# Patient Record
Sex: Female | Born: 1945 | Race: White | Hispanic: No | Marital: Married | Smoking: Never smoker
Health system: Southern US, Community
[De-identification: ages and names within clinical notes are randomized; demographics above are authoritative.]

## PROBLEM LIST (undated history)

## (undated) DIAGNOSIS — G4700 Insomnia, unspecified: Secondary | ICD-10-CM

## (undated) DIAGNOSIS — E785 Hyperlipidemia, unspecified: Secondary | ICD-10-CM

## (undated) DIAGNOSIS — F419 Anxiety disorder, unspecified: Secondary | ICD-10-CM

## (undated) DIAGNOSIS — Z9109 Other allergy status, other than to drugs and biological substances: Secondary | ICD-10-CM

## (undated) DIAGNOSIS — E538 Deficiency of other specified B group vitamins: Secondary | ICD-10-CM

## (undated) DIAGNOSIS — R600 Localized edema: Secondary | ICD-10-CM

## (undated) DIAGNOSIS — E039 Hypothyroidism, unspecified: Secondary | ICD-10-CM

## (undated) HISTORY — PX: INCONTINENCE SURGERY: SHX676

## (undated) HISTORY — DX: Hyperlipidemia, unspecified: E78.5

## (undated) HISTORY — DX: Localized edema: R60.0

## (undated) HISTORY — PX: PARTIAL HYSTERECTOMY: SHX80

## (undated) HISTORY — PX: ELBOW SURGERY: SHX618

## (undated) HISTORY — DX: Other allergy status, other than to drugs and biological substances: Z91.09

## (undated) HISTORY — DX: Insomnia, unspecified: G47.00

---

## 1964-09-11 HISTORY — PX: TONSILLECTOMY: SUR1361

## 1999-09-27 ENCOUNTER — Encounter (INDEPENDENT_AMBULATORY_CARE_PROVIDER_SITE_OTHER): Payer: Self-pay | Admitting: Specialist

## 1999-09-27 ENCOUNTER — Other Ambulatory Visit: Admission: RE | Admit: 1999-09-27 | Discharge: 1999-09-27 | Payer: Self-pay | Admitting: Gastroenterology

## 2009-03-18 ENCOUNTER — Encounter (INDEPENDENT_AMBULATORY_CARE_PROVIDER_SITE_OTHER): Payer: Self-pay | Admitting: *Deleted

## 2010-01-27 ENCOUNTER — Encounter (INDEPENDENT_AMBULATORY_CARE_PROVIDER_SITE_OTHER): Payer: Self-pay | Admitting: *Deleted

## 2010-01-31 ENCOUNTER — Encounter (INDEPENDENT_AMBULATORY_CARE_PROVIDER_SITE_OTHER): Payer: Self-pay | Admitting: *Deleted

## 2010-02-03 ENCOUNTER — Ambulatory Visit: Payer: Self-pay | Admitting: Internal Medicine

## 2010-02-14 ENCOUNTER — Ambulatory Visit: Payer: Self-pay | Admitting: Internal Medicine

## 2010-02-16 ENCOUNTER — Encounter: Payer: Self-pay | Admitting: Internal Medicine

## 2010-04-13 ENCOUNTER — Emergency Department: Payer: Self-pay | Admitting: Emergency Medicine

## 2010-10-11 NOTE — Miscellaneous (Signed)
Summary: Moviprep rx  Clinical Lists Changes  Medications: Added new medication of MOVIPREP 100 GM  SOLR (PEG-KCL-NACL-NASULF-NA ASC-C) As per prep instructions. - Signed Rx of MOVIPREP 100 GM  SOLR (PEG-KCL-NACL-NASULF-NA ASC-C) As per prep instructions.;  #1 x 0;  Signed;  Entered by: Karl Bales RN;  Authorized by: Hilarie Fredrickson MD;  Method used: [id number] [address], [place], [place], [place]  [address], Ph: [phone], Fax: [phone] Observations: Added new observation of NKA: T (02/03/2010 11:20)    Prescriptions: MOVIPREP 100 GM  SOLR (PEG-KCL-NACL-NASULF-NA ASC-C) As per prep instructions.  #1 x 0   Entered by:   Karl Bales RN   Authorized by:   Hilarie Fredrickson MD   Signed by:   Karl Bales RN on 02/03/2010   Method used:   [facility]ronically to        [facility] [phone]* (retail)       [address]       [place], [place]  [address]       Ph: [phone]       Fax: [phone]   RxID:   [id number]

## 2010-10-11 NOTE — Procedures (Signed)
Summary: Colonoscopy  Patient: Rachel Hays Note: All result statuses are Final unless otherwise noted.  Tests: (1) Colonoscopy (COL)   COL Colonoscopy           DONE     [facility]     [address]     [place], [place]  [address]           COLONOSCOPY PROCEDURE REPORT           PATIENT:  Rachel, Hays  MR#:  [id number]     BIRTHDATE:  [birth year], 63 yrs. old  GENDER:  female     ENDOSCOPIST:  Wilhemina Bonito. Eda Keys, MD     REF. BY:  Screening / Recall     PROCEDURE DATE:  02/14/2010     PROCEDURE:  Colonoscopy with snare polypectomy x 2     ASA CLASS:  Class II     INDICATIONS:  family history of colon cancer, history of     hyperplastic polyps ; Father late 80's; 2001 and 2005 w/ HP polyps           MEDICATIONS:   Fentanyl 50 mcg IV, Versed 5 mg IV           DESCRIPTION OF PROCEDURE:   After the risks benefits and     alternatives of the procedure were thoroughly explained, informed     consent was obtained.  Digital rectal exam was performed and     revealed no abnormalities.   The LB [id number] endoscope     was introduced through the anus and advanced to the cecum, which     was identified by both the appendix and ileocecal valve, without     limitations.Time to cecum = 3:17 min.  The quality of the prep was     good, using MoviPrep.  The instrument was then slowly withdrawn     (time = 15:42 min) as the colon was fully examined.     <<PROCEDUREIMAGES>>           FINDINGS:  A 6mm sessile polyp was found in the ascending colon.     Polyp was snared without cautery. Retrieval was successful.   An     8mm sessile polyp was found in the descending colon. Polyp was     snared without cautery. Retrieval was successful.   Mild     diverticulosis was found in the sigmoid colon.Incidental 18mm     lipoma right colon. This was otherwise a normal examination of the     colon.   Retroflexed views in the rectum revealed no     abnormalities.    The scope was then  withdrawn from the patient     and the procedure completed.           COMPLICATIONS:  None     ENDOSCOPIC IMPRESSION:     1) Sessile polyp in the ascending colon - removed     2) Sessile polyp in the descending colon - removed     3) Mild diverticulosis in the sigmoid colon     4) Incidental 18mm lipoma ascending colon     5) Otherwise normal examination           RECOMMENDATIONS:     1) Follow up colonoscopy in 5 years           ______________________________     Wilhemina Bonito. Eda Keys, MD           CC:  Horton Chin, MD; The Patient           n.     eSIGNED:   Wilhemina Bonito. Eda Keys at 02/14/2010 09:34 AM           Cargle, Carilyn Goodpasture, [phone]  Note: An exclamation mark (!) indicates a result that was not dispersed into the flowsheet. Document Creation Date: 02/14/2010 9:35 AM _______________________________________________________________________  (1) Order result status: Final Collection or observation date-time: 02/14/2010 09:24 Requested date-time:  Receipt date-time:  Reported date-time:  Referring Physician:   Ordering Physician: Fransico Setters [phone]) Specimen Source:  Source: Launa Grill Order Number: [id number] Lab site:   Appended Document: Colonoscopy recall     Procedures Next Due Date:    Colonoscopy: 01/2015

## 2010-10-11 NOTE — Letter (Signed)
Summary: Pre Visit No Show Letter  [facility]  [address] [place], [place] [address]   Phone: [phone]  Fax: [phone]        Jan 31, 2010 MRN: [id number]    [facility] [place] [address] [place], [place]  [address]    Dear Ms. Bound,   We have been unable to reach you by phone concerning the pre-procedure visit that you missed on 01/31/10. For this reason,your procedure scheduled on 02/14/10 has been cancelled. Our scheduling staff will gladly assist you with rescheduling your appointments at a more convenient time. Please call our office at [phone] between the hours of 8:00am and 5:00pm, press option #2 to reach an appointment scheduler. Please consider updating your contact numbers at this time so that we can reach you by phone in the future with schedule changes or results.    Thank you,    Wyona Almas RN [facility]

## 2010-10-11 NOTE — Letter (Signed)
Summary: Patient Notice- Polyp Results  [facility]  [address] [place], [place] [address]   Phone: [phone]  Fax: [phone]        February 16, 2010 MRN: [id number]    [facility] [address] [place], [place]  [address]    Dear Ms. Spires,  I am pleased to inform you that the colon polyp(s) removed during your recent colonoscopy was (were) found to be benign (no cancer detected) upon pathologic examination.  I recommend you have a repeat colonoscopy examination in 5 years to look for recurrent polyps, as having colon polyps increases your risk for having recurrent polyps or even colon cancer in the future.  Should you develop new or worsening symptoms of abdominal pain, bowel habit changes or bleeding from the rectum or bowels, please schedule an evaluation with either your primary care physician or with me.  Additional information/recommendations:  __ No further action with gastroenterology is needed at this time. Please      follow-up with your primary care physician for your other healthcare      needs.  Please call us if you are having persistent problems or have questions about your condition that have not been fully answered at this time.  Sincerely,  Hilarie Fredrickson MD  This letter has been electronically signed by your physician.  Appended Document: Patient Notice- Polyp Results letter mailed.

## 2010-10-11 NOTE — Letter (Signed)
Summary: [facility] Instructions  [facility]  [address] [place], [place] [address]   Phone: [phone]  Fax: [phone]       [facility]    12/02/1945    MRN: [id number]        Procedure Day /Date:  02/14/10   Monday       Arrival Time:  8:00am      Procedure Time: 9:00am     Location of Procedure:                    _ x_  [facility] (4th Floor)                        PREPARATION FOR COLONOSCOPY WITH MOVIPREP   Starting 5 days prior to your procedure _6/1/11 _ do not eat nuts, seeds, popcorn, corn, beans, peas,  salads, or any raw vegetables.  Do not take any fiber supplements (e.g. Metamucil, Citrucel, and Benefiber).  THE DAY BEFORE YOUR PROCEDURE         DATE:  02/13/10  DAY:  Sunday  1.  Drink clear liquids the entire day-NO SOLID FOOD  2.  Do not drink anything colored red or purple.  Avoid juices with pulp.  No orange juice.  3.  Drink at least 64 oz. (8 glasses) of fluid/clear liquids during the day to prevent dehydration and help the prep work efficiently.  CLEAR LIQUIDS INCLUDE: Water Jello Ice Popsicles Tea (sugar ok, no milk/cream) Powdered fruit flavored drinks Coffee (sugar ok, no milk/cream) Gatorade Juice: apple, white grape, white cranberry  Lemonade Clear bullion, consomm, broth Carbonated beverages (any kind) Strained chicken noodle soup Hard Candy                             4.  In the morning, mix first dose of MoviPrep solution:    Empty 1 Pouch A and 1 Pouch B into the disposable container    Add lukewarm drinking water to the top line of the container. Mix to dissolve    Refrigerate (mixed solution should be used within 24 hrs)  5.  Begin drinking the prep at 5:00 p.m. The MoviPrep container is divided by 4 marks.   Every 15 minutes drink the solution down to the next mark (approximately 8 oz) until the full liter is complete.   6.  Follow completed prep with 16 oz of clear liquid of your choice  (Nothing red or purple).  Continue to drink clear liquids until bedtime.  7.  Before going to bed, mix second dose of MoviPrep solution:    Empty 1 Pouch A and 1 Pouch B into the disposable container    Add lukewarm drinking water to the top line of the container. Mix to dissolve    Refrigerate  THE DAY OF YOUR PROCEDURE      DATE:  02/14/10  DAY:  Monday  Beginning at 5:00am  (5 hours before procedure):         1. Every 15 minutes, drink the solution down to the next mark (approx 8 oz) until the full liter is complete.  2. Follow completed prep with 16 oz. of clear liquid of your choice.    3. You may drink clear liquids until   7:00am (2 HOURS BEFORE PROCEDURE).   MEDICATION INSTRUCTIONS  Unless otherwise instructed, you should take regular prescription medications with a small  sip of water   as early as possible the morning of your procedure.         OTHER INSTRUCTIONS  You will need a responsible adult at least 66 years of age to accompany you and drive you home.   This person must remain in the waiting room during your procedure.  Wear loose fitting clothing that is easily removed.  Leave jewelry and other valuables at home.  However, you may wish to bring a book to read or  an iPod/MP3 player to listen to music as you wait for your procedure to start.  Remove all body piercing jewelry and leave at home.  Total time from sign-in until discharge is approximately 2-3 hours.  You should go home directly after your procedure and rest.  You can resume normal activities the  day after your procedure.  The day of your procedure you should not:   Drive   Make legal decisions   Operate machinery   Drink alcohol   Return to work  You will receive specific instructions about eating, activities and medications before you leave.    The above instructions have been reviewed and explained to me by   Karl Bales RN  Feb 03, 2010 11:22 AM    I fully  understand and can verbalize these instructions _____________________________ Date _________

## 2015-02-11 ENCOUNTER — Encounter: Payer: Self-pay | Admitting: Internal Medicine

## 2015-04-12 ENCOUNTER — Encounter: Payer: Self-pay | Admitting: Internal Medicine

## 2015-10-12 ENCOUNTER — Ambulatory Visit (AMBULATORY_SURGERY_CENTER): Payer: Self-pay

## 2015-10-12 VITALS — Ht 63.0 in | Wt 170.6 lb

## 2015-10-12 DIAGNOSIS — Z860100 Personal history of colon polyps, unspecified: Secondary | ICD-10-CM

## 2015-10-12 MED ORDER — SUPREP BOWEL PREP KIT 17.5-3.13-1.6 GM/177ML PO SOLN: 1.0000 | Freq: Once | ORAL | Status: DC

## 2015-10-12 NOTE — Progress Notes (Signed)
No allergies to eggs or soy No diet/weight loss meds No past problems with anesthesia No home oxygen  Has email and internet; registered for emmi 

## 2015-10-26 ENCOUNTER — Ambulatory Visit (AMBULATORY_SURGERY_CENTER): Payer: BC Managed Care – PPO | Admitting: Internal Medicine

## 2015-10-26 ENCOUNTER — Encounter: Payer: Self-pay | Admitting: Internal Medicine

## 2015-10-26 VITALS — BP 112/86 | HR 68 | Temp 97.5°F | Resp 16 | Ht 63.0 in | Wt 170.0 lb

## 2015-10-26 DIAGNOSIS — Z86010 Personal history of colonic polyps: Secondary | ICD-10-CM | POA: Diagnosis not present

## 2015-10-26 DIAGNOSIS — K635 Polyp of colon: Secondary | ICD-10-CM | POA: Diagnosis not present

## 2015-10-26 DIAGNOSIS — D124 Benign neoplasm of descending colon: Secondary | ICD-10-CM

## 2015-10-26 DIAGNOSIS — Z800 Family history of malignant neoplasm of digestive organs: Secondary | ICD-10-CM

## 2015-10-26 DIAGNOSIS — D122 Benign neoplasm of ascending colon: Secondary | ICD-10-CM

## 2015-10-26 MED ORDER — SODIUM CHLORIDE 0.9 % IV SOLN: 500.0000 mL | INTRAVENOUS | Status: DC

## 2015-10-26 NOTE — Progress Notes (Signed)
Called to room to assist during endoscopic procedure.  Patient ID and intended procedure confirmed with present staff. Received instructions for my participation in the procedure from the performing physician.  

## 2015-10-26 NOTE — Op Note (Signed)
[facility]  [address] [place], [address]   COLONOSCOPY PROCEDURE REPORT  PATIENT: Rachel Hays, Rachel Hays  MR#: [id number] BIRTHDATE: 1946-01-12 , 89  yrs. old GENDER: female ENDOSCOPIST: Eustace Quail, MD REFERRED [id number] Program Recall PROCEDURE DATE:  10/26/2015 PROCEDURE:   Colonoscopy, surveillance and Colonoscopy with snare polypectomy X2 First Screening Colonoscopy - Avg.  risk and is 50 yrs.  old or older - No.  Prior Negative Screening - Now for repeat screening. N/A  History of Adenoma - Now for follow-up colonoscopy & has been > or = to 3 yrs.  Yes hx of adenoma.  Has been 3 or more years since last colonoscopy.  Polyps removed today? Yes ASA CLASS:   Class II INDICATIONS:Surveillance due to prior colonic neoplasia and FH Colon or Rectal Adenocarcinoma.. Father and 38s, prior examinations 2001, 2005, and 2011 with hyperplastic polyps, including right-sided MEDICATIONS: Monitored anesthesia care and Propofol 200 mg IV  DESCRIPTION OF PROCEDURE:   After the risks benefits and alternatives of the procedure were thoroughly explained, informed consent was obtained.  The digital rectal exam revealed no abnormalities of the rectum.   The LB [id number]  endoscope was introduced through the anus and advanced to the cecum, which was identified by both the appendix and ileocecal valve. No adverse events experienced.   The quality of the prep was excellent. (Suprep was used)  The instrument was then slowly withdrawn as the colon was fully examined. Estimated blood loss is zero unless otherwise noted in this procedure report.  COLON FINDINGS: Two polyps ranging between 3-38mm in size were found in the descending colon and ascending colon.  A polypectomy was performed with a cold snare.  The resection was complete, the polyp tissue was completely retrieved and sent to histology.   There was moderate diverticulosis noted in the left colon.   The  examination was otherwise normal.  Retroflexed views revealed internal hemorrhoids. The time to cecum = 2.8 Withdrawal time = 13.7   The scope was withdrawn and the procedure completed. COMPLICATIONS: There were no immediate complications.  ENDOSCOPIC IMPRESSION: 1.   Two polyps were found in the descending colon and ascending colon; polypectomy was performed with a cold snare 2.   Moderate diverticulosis was noted in the left colon 3.   The examination was otherwise normal  RECOMMENDATIONS: 1. Follow up colonoscopy in 5 years  eSigned:  Eustace Quail, MD 10/26/2015 1:58 PM   cc: The Patient and Belinda Fisher, MD

## 2015-10-26 NOTE — Patient Instructions (Signed)
Handouts given: Polyps and Diverticulosis.   YOU HAD AN ENDOSCOPIC PROCEDURE TODAY AT [facility]:   Refer to the procedure report that was given to you for any specific questions about what was found during the examination.  If the procedure report does not answer your questions, please call your gastroenterologist to clarify.  If you requested that your care partner not be given the details of your procedure findings, then the procedure report has been included in a sealed envelope for you to review at your convenience later.  YOU SHOULD EXPECT: Some feelings of bloating in the abdomen. Passage of more gas than usual.  Walking can help get rid of the air that was put into your GI tract during the procedure and reduce the bloating. If you had a lower endoscopy (such as a colonoscopy or flexible sigmoidoscopy) you may notice spotting of blood in your stool or on the toilet paper. If you underwent a bowel prep for your procedure, you may not have a normal bowel movement for a few days.  Please Note:  You might notice some irritation and congestion in your nose or some drainage.  This is from the oxygen used during your procedure.  There is no need for concern and it should clear up in a day or so.  SYMPTOMS TO REPORT IMMEDIATELY:   Following lower endoscopy (colonoscopy or flexible sigmoidoscopy):  Excessive amounts of blood in the stool  Significant tenderness or worsening of abdominal pains  Swelling of the abdomen that is new, acute  Fever of 100F or higher For urgent or emergent issues, a gastroenterologist can be reached at any hour by calling [phone].   DIET: Your first meal following the procedure should be a small meal and then it is ok to progress to your normal diet. Heavy or fried foods are harder to digest and may make you feel nauseous or bloated.  Likewise, meals heavy in dairy and vegetables can increase bloating.  Drink plenty of fluids but you should avoid  alcoholic beverages for 24 hours.  ACTIVITY:  You should plan to take it easy for the rest of today and you should NOT DRIVE or use heavy machinery until tomorrow (because of the sedation medicines used during the test).    FOLLOW UP: Our staff will call the number listed on your records the next business day following your procedure to check on you and address any questions or concerns that you may have regarding the information given to you following your procedure. If we do not reach you, we will leave a message.  However, if you are feeling well and you are not experiencing any problems, there is no need to return our call.  We will assume that you have returned to your regular daily activities without incident.  If any biopsies were taken you will be contacted by phone or by letter within the next 1-3 weeks.  Please call us at [phone] if you have not heard about the biopsies in 3 weeks.    SIGNATURES/CONFIDENTIALITY: You and/or your care partner have signed paperwork which will be entered into your electronic medical record.  These signatures attest to the fact that that the information above on your After Visit Summary has been reviewed and is understood.  Full responsibility of the confidentiality of this discharge information lies with you and/or your care-partner.

## 2015-10-26 NOTE — Progress Notes (Signed)
To recovery, report to Tyrell, RN, VSS. 

## 2015-10-26 NOTE — Progress Notes (Signed)
Rachel Hays, Tech reported pt has a yellowish colored, the size of a .50 piece, bruise on pt's right hip.  maw

## 2015-10-27 ENCOUNTER — Telehealth: Payer: Self-pay | Admitting: Emergency Medicine

## 2015-10-27 NOTE — Telephone Encounter (Signed)
  Follow up Call-  Call back number 10/26/2015  Post procedure Call Back phone  # [phone]  Permission to leave phone message Yes     Patient questions:  Do you have a fever, pain , or abdominal swelling? No. Pain Score  0 *  Have you tolerated food without any problems? Yes.    Have you been able to return to your normal activities? Yes.    Do you have any questions about your discharge instructions: Diet   No. Medications  No. Follow up visit  No.  Do you have questions or concerns about your Care? No.  Actions: * If pain score is 4 or above: No action needed, pain <4.

## 2015-11-02 ENCOUNTER — Encounter: Payer: Self-pay | Admitting: Internal Medicine

## 2015-11-24 ENCOUNTER — Other Ambulatory Visit (INDEPENDENT_AMBULATORY_CARE_PROVIDER_SITE_OTHER): Payer: BC Managed Care – PPO

## 2015-11-24 ENCOUNTER — Ambulatory Visit (INDEPENDENT_AMBULATORY_CARE_PROVIDER_SITE_OTHER): Payer: BC Managed Care – PPO | Admitting: Internal Medicine

## 2015-11-24 ENCOUNTER — Encounter: Payer: Self-pay | Admitting: Internal Medicine

## 2015-11-24 VITALS — BP 126/74 | HR 79 | Ht 63.0 in | Wt 168.0 lb

## 2015-11-24 DIAGNOSIS — R05 Cough: Secondary | ICD-10-CM

## 2015-11-24 DIAGNOSIS — R058 Other specified cough: Secondary | ICD-10-CM

## 2015-11-24 LAB — CBC WITH DIFFERENTIAL/PLATELET
BASOS ABS: 0 10*3/uL (ref 0.0–0.1)
Basophils Relative: 0.4 % (ref 0.0–3.0)
EOS ABS: 0.2 10*3/uL (ref 0.0–0.7)
Eosinophils Relative: 4.5 % (ref 0.0–5.0)
HEMATOCRIT: 45.4 % (ref 36.0–46.0)
Hemoglobin: 15.4 g/dL — ABNORMAL HIGH (ref 12.0–15.0)
LYMPHS ABS: 1.6 10*3/uL (ref 0.7–4.0)
LYMPHS PCT: 30.1 % (ref 12.0–46.0)
MCHC: 33.8 g/dL (ref 30.0–36.0)
MCV: 92.2 fl (ref 78.0–100.0)
MONOS PCT: 10.7 % (ref 3.0–12.0)
Monocytes Absolute: 0.6 10*3/uL (ref 0.1–1.0)
NEUTROS ABS: 3 10*3/uL (ref 1.4–7.7)
NEUTROS PCT: 54.3 % (ref 43.0–77.0)
PLATELETS: 171 10*3/uL (ref 150.0–400.0)
RBC: 4.92 Mil/uL (ref 3.87–5.11)
RDW: 12.9 % (ref 11.5–15.5)
WBC: 5.5 10*3/uL (ref 4.0–10.5)

## 2015-11-24 MED ORDER — BENZONATATE 200 MG PO CAPS: 200.0000 mg | ORAL_CAPSULE | Freq: Three times a day (TID) | ORAL | Status: AC | PRN

## 2015-11-24 MED ORDER — PANTOPRAZOLE SODIUM 40 MG PO TBEC: 40.0000 mg | DELAYED_RELEASE_TABLET | Freq: Every day | ORAL | Status: DC

## 2015-11-24 MED ORDER — FAMOTIDINE 20 MG PO TABS: ORAL_TABLET | ORAL | Status: DC

## 2015-11-24 MED ORDER — PREDNISONE 10 MG PO TABS: ORAL_TABLET | ORAL | Status: DC

## 2015-11-24 NOTE — Assessment & Plan Note (Signed)
Allergy profile 11/24/2015 >  Eos 0.2 /  IgE pending    Of the three most common causes of chronic cough, only one (GERD)  can actually cause the other two (asthma and post nasal drip syndrome)  and perpetuate the cylce of cough inducing airway trauma, inflammation, heightened sensitivity to reflux which is prompted by the cough itself via a cyclical mechanism.    This may partially respond to steroids and look like asthma and post nasal drainage but never erradicated completely unless the cough and the secondary reflux are eliminated, preferably both at the same time.  While not intuitively obvious, many patients with chronic low grade reflux do not cough until there is a secondary insult that disturbs the protective epithelial barrier and exposes sensitive nerve endings.  This can be viral or direct physical injury such as with an endotracheal tube.   The point is that once this occurs, it is difficult to eliminate using anything but a maximally effective acid suppression regimen at least in the short run, accompanied by an appropriate diet to address non acid GERD.    The standardized cough guidelines published in Chest by Lissa Morales in 2006 are still the best available and consist of a multiple step process (up to 12!) , not a single office visit,  and are intended  to address this problem logically,  with an alogrithm dependent on response to empiric treatment at  each progressive step  to determine a specific diagnosis with  minimal addtional testing needed. Therefore if adherence is an issue or can't be accurately verified,  it's very unlikely the standard evaluation and treatment will be successful here.    Furthermore, response to therapy (other than acute cough suppression, which should only be used short term with avoidance of narcotic containing cough syrups if possible), can be a gradual process for which the patient may perceive immediate benefit.  Unlike going to an eye doctor where the  best perscription is almost always the first one and is immediately effective, this is almost never the case in the management of chronic cough syndromes. Therefore the patient needs to commit up front to consistently adhere to recommendations  for up to 6 weeks of therapy directed at the likely underlying problem(s) before the response can be reasonably evaluated.   For now rec max gerd and h1 to eliminate pnds and then regroup in 2 weeks if still coughing  Total time devoted to counseling  = 35/89m review case with pt/ discussion of options/alternatives/ personally creating in presence of pt  then going over specific  Instructions directly with the pt including how to use all of the meds but in particular covering each new medication in detail (see avs)

## 2015-11-24 NOTE — Progress Notes (Signed)
Subjective:    Patient ID: Rachel Hays, female    DOB: 1946-03-21,     MRN: [id number]  HPI  62 yowf never a regular smoker new onset runny eyes x 2014 typically starts in oct and goes on all winter until spring assoc with daily cough self referred to pulmonary clinic 11/24/2015     11/24/2015 1st [facility] office visit/ [facility]   Chief Complaint  Patient presents with  . Pulmonary Consult    Self referral for chronic cough x 2 years. Pt c/o dry cough and mild PND. Pt denies SOB and CP/tightness.   cough is worse at meals and seems more day than noct and rarely wakes her up / rhinitis better with pred but not the cough Zyrtec>  no improvement even in  the sensation of drainage      Kouffman Reflux v Neurogenic Cough Differentiator Reflux Comments  Do you awaken from a sound sleep coughing violently?                            With trouble breathing? no   Do you have choking episodes when you cannot  Get enough air, gasping for air ?              no   Do you usually cough when you lie down into  The bed, or when you just lie down to rest ?                          sometimes   Do you usually cough after meals or eating?         Yes   Do you cough when (or after) you bend over?    Yes   GERD SCORE     Kouffman Reflux v Neurogenic Cough Differentiator Neurogenic   Do you more-or-less cough all day long? sporadic   Does change of temperature make you cough? No    Does laughing or chuckling cause you to cough? no   Do fumes (perfume, automobile fumes, burned  Toast, etc.,) cause you to cough ?      no   Does speaking, singing, or talking on the phone cause you to cough   ?               No    Neurogenic/Airway score      No obvious other patterns in day to day or daytime variabilty or assoc ever with excess/ purulent sputum or mucus plugs   or cp or chest tightness, subjective wheeze overt sinus or hb symptoms. No unusual exp hx or h/o childhood pna/ asthma or knowledge of  premature birth.  Sleeping ok without nocturnal  or early am exacerbation  of respiratory  c/o's or need for noct saba. Also denies any obvious fluctuation of symptoms with weather or environmental changes or other aggravating or alleviating factors except as outlined above   Current Medications, Allergies, Complete Past Medical History, Past Surgical History, Family History, and Social History were reviewed in [employer] record.            Review of Systems  Constitutional: Negative for fever and unexpected weight change.  HENT: Positive for postnasal drip. Negative for congestion, dental problem, ear pain, nosebleeds, rhinorrhea, sinus pressure, sneezing, sore throat and trouble swallowing.   Eyes: Negative for redness and itching.  Respiratory: Positive for cough. Negative for  chest tightness, shortness of breath and wheezing.   Cardiovascular: Negative for palpitations and leg swelling.  Gastrointestinal: Negative for nausea and vomiting.  Genitourinary: Negative for dysuria.  Musculoskeletal: Negative for joint swelling.  Skin: Negative for rash.  Neurological: Negative for headaches.  Hematological: Does not bruise/bleed easily.  Psychiatric/Behavioral: Negative for dysphoric mood. The patient is not nervous/anxious.        Objective:   Physical Exam   amb wf nad but freq throat clearing occ followed by honking upper airway pattern cough   Wt Readings from Last 3 Encounters:  11/24/15 168 lb (76.204 kg)  10/26/15 170 lb (77.111 kg)  10/12/15 170 lb 9.6 oz (77.384 kg)    Vital signs reviewed    HEENT: nl dentition, turbinates, and oropharynx which is pristine s viz pnd. Nl external ear canals without cough reflex   NECK :  without JVD/Nodes/TM/ nl carotid upstrokes bilaterally   LUNGS: no acc muscle use,  Nl contour chest which is clear to A and P bilaterally with variable cough urge at end exp    CV:  RRR  no s3 or murmur or increase in  P2, no edema   ABD:  soft and nontender with nl inspiratory excursion in the supine position. No bruits or organomegaly, bowel sounds nl  MS:  Nl gait/ ext warm without deformities, calf tenderness, cyanosis or clubbing No obvious joint restrictions   SKIN: warm and dry without lesions    NEURO:  alert, approp, nl sensorium with  no motor deficits    cxr 3/ 17/16 cxr brought in by pt > wnl      Assessment & Plan:

## 2015-11-24 NOTE — Patient Instructions (Addendum)
Please remember to go to the lab  department downstairs for your tests - we will call you with the results when they are available.  Pantoprazole (protonix) 40 mg   Take  30-60 min before first meal of the day and Pepcid (famotidine)  20 mg one @  bedtime until return to office - this is the best way to tell whether stomach acid is contributing to your problem.    GERD (REFLUX)  is an extremely common cause of respiratory symptoms just like yours , many times with no obvious heartburn at all.    It can be treated with medication, but also with lifestyle changes including elevation of the head of your bed (ideally with 6 inch  bed blocks),  Smoking cessation, avoidance of late meals, excessive alcohol, and avoid fatty foods, chocolate, peppermint, colas, red wine, and acidic juices such as orange juice.  NO MINT OR MENTHOL PRODUCTS SO NO COUGH DROPS  USE SUGARLESS CANDY INSTEAD (Jolley ranchers or Stover's or Life Savers) or even ice chips will also do - the key is to swallow to prevent all throat clearing. NO OIL BASED VITAMINS - use powdered substitutes.  Cough > tessalon pearl 200 mg  one every 8 hours until no longer coughing at all   For drainage since you don't thing zyrtec is working > For drainage / throat tickle try take CHLORPHENIRAMINE  4 mg - take one every 4 hours as needed - available over the counter- may cause drowsiness so start with just a bedtime dose or two and see how you tolerate it before trying in daytime     If not better return in 2 weeks When return bring your medications in 2 separate bags, the ones you take no matter(automatically)  what vs the as needed (only when you feel you need them)

## 2015-11-26 LAB — RESPIRATORY ALLERGY PROFILE REGION II ~~LOC~~
Allergen, Comm Silver Birch, t9: <0.1 kU/L
Allergen, Cottonwood, t14: <0.1 kU/L
Allergen, D pternoyssinus,d7: <0.1 kU/L
Allergen, Mulberry, t76: <0.1 kU/L
Aspergillus fumigatus, m3: <0.1 kU/L
Bermuda Grass: <0.1 kU/L
Cladosporium Herbarum: <0.1 kU/L
Cockroach: <0.1 kU/L
Common Ragweed: <0.1 kU/L
D. farinae: <0.1 kU/L
IgE (Immunoglobulin E), Serum: 11 kU/L (ref <115)
Johnson Grass: <0.1 kU/L
Penicillium Notatum: <0.1 kU/L
Timothy Grass: <0.1 kU/L

## 2015-12-15 ENCOUNTER — Encounter: Payer: Self-pay | Admitting: Internal Medicine

## 2015-12-15 ENCOUNTER — Ambulatory Visit (INDEPENDENT_AMBULATORY_CARE_PROVIDER_SITE_OTHER): Payer: BC Managed Care – PPO | Admitting: Internal Medicine

## 2015-12-15 VITALS — BP 112/80 | HR 65 | Ht 63.0 in | Wt 163.0 lb

## 2015-12-15 DIAGNOSIS — R05 Cough: Secondary | ICD-10-CM

## 2015-12-15 DIAGNOSIS — R058 Other specified cough: Secondary | ICD-10-CM

## 2015-12-15 NOTE — Patient Instructions (Signed)
For drainage / throat tickle try take CHLORPHENIRAMINE  4 mg - take one every 4 hours as needed - available over the counter- may cause drowsiness so start with just a bedtime dose or two and see how you tolerate it before trying in daytime    Continue all your present medications   Please schedule a follow up office visit in 6 weeks, call sooner if needed

## 2015-12-15 NOTE — Progress Notes (Signed)
Subjective:    Patient ID: Rachel Hays, female    DOB: February 22, 1946,     MRN: [id number]    Brief patient profile:  30 yowf never a regular smoker new onset runny eyes x 2014 typically starts in oct and goes on all winter until spring assoc with daily cough year round since 2013 self referred to pulmonary clinic 11/24/2015    History of Present Illness  11/24/2015 1st [facility] office visit/ Rachel Hays   Chief Complaint  Patient presents with  . Pulmonary Consult    Self referral for chronic cough x 2 years. Pt c/o dry cough and mild PND. Pt denies SOB and CP/tightness.   cough is worse at meals and seems more day than noct and rarely wakes her up / rhinitis better with pred but not the cough Zyrtec>  no improvement even in  the sensation of drainage      Kouffman Reflux v Neurogenic Cough Differentiator Reflux Comments  Do you awaken from a sound sleep coughing violently?                            With trouble breathing? no   Do you have choking episodes when you cannot  Get enough air, gasping for air ?              no   Do you usually cough when you lie down into  The bed, or when you just lie down to rest ?                          sometimes   Do you usually cough after meals or eating?         Yes   Do you cough when (or after) you bend over?    Yes   GERD SCORE     Kouffman Reflux v Neurogenic Cough Differentiator Neurogenic   Do you more-or-less cough all day long? sporadic   Does change of temperature make you cough? No    Does laughing or chuckling cause you to cough? no   Do fumes (perfume, automobile fumes, burned  Toast, etc.,) cause you to cough ?      no   Does speaking, singing, or talking on the phone cause you to cough   ?               No    Neurogenic/Airway score    rec Please remember to go to the lab  department downstairs for your tests - we will call you with the results when they are available. Pantoprazole (protonix) 40 mg   Take  30-60 min  before first meal of the day and Pepcid (famotidine)  20 mg one @  bedtime until return to office - this is the best way to tell whether stomach acid is contributing to your problem.   GERD diet  Cough > tessalon pearl 200 mg  one every 8 hours until no longer coughing at all  For drainage since you don't thing zyrtec is working > For drainage / throat tickle try take CHLORPHENIRAMINE  4 mg - take one every 4 hours as needed - available over the counter- may cause drowsiness so start with just a bedtime dose or two and see how you tolerate it before trying in daytime      12/15/2015  f/u ov/Rachel Hays re: chronic cough / pnds  Chief  Complaint  Patient presents with  . Follow-up    Cough has improved some. Still has minimal PND. No new co's today.   only taking h1 at hs seems to work well, no longer needing tessilon   No obvious day to day or daytime variability or assoc sob or cp or chest tightness, subjective wheeze or overt sinus or hb symptoms. No unusual exp hx or h/o childhood pna/ asthma or knowledge of premature birth.  Sleeping ok without nocturnal  or early am exacerbation  of respiratory  c/o's or need for noct saba. Also denies any obvious fluctuation of symptoms with weather or environmental changes or other aggravating or alleviating factors except as outlined above   Current Medications, Allergies, Complete Past Medical History, Past Surgical History, Family History, and Social History were reviewed in [employer] record.  ROS  The following are not active complaints unless bolded sore throat, dysphagia, dental problems, itching, sneezing,  nasal congestion or excess/ purulent secretions, ear ache,   fever, chills, sweats, unintended wt loss, classically pleuritic or exertional cp, hemoptysis,  orthopnea pnd or leg swelling, presyncope, palpitations, abdominal pain, anorexia, nausea, vomiting, diarrhea  or change in bowel or bladder habits, change in stools or urine,  dysuria,hematuria,  rash, arthralgias, visual complaints, headache, numbness, weakness or ataxia or problems with walking or coordination,  change in mood/affect or memory.                  Objective:   Physical Exam   amb wf nad     12/15/2015        163   11/24/15 168 lb (76.204 kg)  10/26/15 170 lb (77.111 kg)  10/12/15 170 lb 9.6 oz (77.384 kg)    Vital signs reviewed    HEENT: nl dentition, turbinates, and oropharynx which is pristine s viz pnd. Nl external ear canals without cough reflex   NECK :  without JVD/Nodes/TM/ nl carotid upstrokes bilaterally   LUNGS: no acc muscle use,  Nl contour chest which is clear to A and P bilaterally with variable cough urge at end exp    CV:  RRR  no s3 or murmur or increase in P2, no edema   ABD:  soft and nontender with nl inspiratory excursion in the supine position. No bruits or organomegaly, bowel sounds nl  MS:  Nl gait/ ext warm without deformities, calf tenderness, cyanosis or clubbing No obvious joint restrictions   SKIN: warm and dry without lesions    NEURO:  alert, approp, nl sensorium with  no motor deficits    cxr 3/ 17/16 cxr brought in by pt > wnl      Assessment & Plan:

## 2015-12-16 NOTE — Assessment & Plan Note (Signed)
Allergy profile 11/24/2015 >  Eos 0.2 /  IgE  11 neg RAST    Improved but not eliminated on gerd rx so rec add h1 prn daytime if tolerates.  I had an extended discussion with the patient reviewing all relevant studies completed to date and  lasting 15 to 20 minutes of a 25 minute visit   The standardized cough guidelines published in Chest by Lissa Morales in 2006 are still the best available and consist of a multiple step process (up to 12!) , not a single office visit,  and are intended  to address this problem logically,  with an alogrithm dependent on response to empiric treatment at  each progressive step  to determine a specific diagnosis with  minimal addtional testing needed. Therefore if adherence is an issue or can't be accurately verified,  it's very unlikely the standard evaluation and treatment will be successful here.    Furthermore, response to therapy (other than acute cough suppression, which should only be used short term with avoidance of narcotic containing cough syrups if possible), can be a gradual process for which the patient may not perceive immediate benefit.   Each maintenance medication was reviewed in detail including most importantly the difference between maintenance and prns and under what circumstances the prns are to be triggered using an action plan format that is not reflected in the computer generated alphabetically organized AVS.    Please see instructions for details which were reviewed in writing and the patient given a copy highlighting the part that I personally wrote and discussed at today's ov.

## 2016-01-26 ENCOUNTER — Ambulatory Visit: Payer: BC Managed Care – PPO | Admitting: Internal Medicine

## 2016-03-06 ENCOUNTER — Other Ambulatory Visit: Payer: Self-pay | Admitting: Internal Medicine

## 2016-03-06 MED ORDER — PANTOPRAZOLE SODIUM 40 MG PO TBEC: 40.0000 mg | DELAYED_RELEASE_TABLET | Freq: Every day | ORAL | Status: DC

## 2016-05-03 ENCOUNTER — Other Ambulatory Visit: Payer: Self-pay | Admitting: Internal Medicine

## 2016-07-18 ENCOUNTER — Encounter: Payer: Self-pay | Admitting: Emergency Medicine

## 2016-07-18 ENCOUNTER — Emergency Department: Payer: BC Managed Care – PPO

## 2016-07-18 ENCOUNTER — Emergency Department
Admission: EM | Admit: 2016-07-18 | Discharge: 2016-07-19 | Disposition: A | Discharge status: Home / Self Care | Payer: BC Managed Care – PPO | Attending: Emergency Medicine | Admitting: Emergency Medicine

## 2016-07-18 DIAGNOSIS — Z79899 Other long term (current) drug therapy: Secondary | ICD-10-CM | POA: Diagnosis not present

## 2016-07-18 DIAGNOSIS — K8020 Calculus of gallbladder without cholecystitis without obstruction: Secondary | ICD-10-CM | POA: Diagnosis not present

## 2016-07-18 DIAGNOSIS — R1010 Upper abdominal pain, unspecified: Secondary | ICD-10-CM | POA: Diagnosis present

## 2016-07-18 DIAGNOSIS — R160 Hepatomegaly, not elsewhere classified: Secondary | ICD-10-CM | POA: Diagnosis not present

## 2016-07-18 DIAGNOSIS — N390 Urinary tract infection, site not specified: Secondary | ICD-10-CM | POA: Diagnosis not present

## 2016-07-18 DIAGNOSIS — R1900 Intra-abdominal and pelvic swelling, mass and lump, unspecified site: Secondary | ICD-10-CM

## 2016-07-18 LAB — CBC
HCT: 44.5 % (ref 35.0–47.0)
Hemoglobin: 15 g/dL (ref 12.0–16.0)
MCH: 31 pg (ref 26.0–34.0)
MCHC: 33.7 g/dL (ref 32.0–36.0)
MCV: 92.1 fL (ref 80.0–100.0)
PLATELETS: 151 10*3/uL (ref 150–440)
RBC: 4.83 MIL/uL (ref 3.80–5.20)
RDW: 13.4 % (ref 11.5–14.5)
WBC: 9.1 10*3/uL (ref 3.6–11.0)

## 2016-07-18 LAB — URINALYSIS COMPLETE WITH MICROSCOPIC (ARMC ONLY)
BILIRUBIN URINE: NEGATIVE
GLUCOSE, UA: NEGATIVE mg/dL
Nitrite: NEGATIVE
Protein, ur: NEGATIVE mg/dL
Specific Gravity, Urine: 1.004 — ABNORMAL LOW (ref 1.005–1.030)
pH: 6 (ref 5.0–8.0)

## 2016-07-18 LAB — TROPONIN I: Troponin I: <0.03 ng/mL (ref <0.03)

## 2016-07-18 LAB — COMPREHENSIVE METABOLIC PANEL
ALBUMIN: 4 g/dL (ref 3.5–5.0)
ALT: 19 U/L (ref 14–54)
AST: 25 U/L (ref 15–41)
Alkaline Phosphatase: 62 U/L (ref 38–126)
Anion gap: 10 (ref 5–15)
BUN: 11 mg/dL (ref 6–20)
CHLORIDE: 102 mmol/L (ref 101–111)
CO2: 27 mmol/L (ref 22–32)
Calcium: 9.2 mg/dL (ref 8.9–10.3)
Creatinine, Ser: 0.91 mg/dL (ref 0.44–1.00)
GFR calc Af Amer: >60 mL/min (ref >60)
Glucose, Bld: 94 mg/dL (ref 65–99)
POTASSIUM: 3.5 mmol/L (ref 3.5–5.1)
SODIUM: 139 mmol/L (ref 135–145)
Total Bilirubin: 2.1 mg/dL — ABNORMAL HIGH (ref 0.3–1.2)
Total Protein: 6.9 g/dL (ref 6.5–8.1)

## 2016-07-18 LAB — LIPASE, BLOOD: LIPASE: 18 U/L (ref 11–51)

## 2016-07-18 MED ORDER — ONDANSETRON HCL 4 MG/2ML IJ SOLN
4.0000 mg | Freq: Once | INTRAMUSCULAR | Status: AC
  Administered 2016-07-18: 4 mg via INTRAVENOUS
  Filled 2016-07-18: qty 2

## 2016-07-18 MED ORDER — MORPHINE SULFATE (PF) 2 MG/ML IV SOLN
2.0000 mg | Freq: Once | INTRAVENOUS | Status: AC
  Administered 2016-07-18: 2 mg via INTRAVENOUS
  Filled 2016-07-18: qty 1

## 2016-07-18 NOTE — ED Triage Notes (Signed)
Pt in with co upper abd pain since this am, had same episode 3 weeks ago but did not see MD. States worse epigastric area, denies any n.v.d or dysuria.

## 2016-07-18 NOTE — ED Provider Notes (Signed)
[facility] Emergency Department Provider Note   ____________________________________________   First MD Initiated Contact with Patient 07/18/16 2317     (approximate)  I have reviewed the triage vital signs and the nursing notes.   HISTORY  Chief Complaint Abdominal Pain    HPI Rachel Hays is a 70 y.o. female who presents to the ED from home with a chief complain of abdominal pain. Patient reports upper abdominal pain since yesterday morning, waxing/waning. Describes pressure-like sensation not associated with nausea, vomiting or diarrhea. Reports similar episode 3 weeks ago but resolved after one hour. Does not seem to be affected by food. Denies recent fever, chills, chest pain, shortness of breath, dysuria, diarrhea. Denies recent travel trauma. Nothing makes her symptoms better or worse.   Past Medical History:  Diagnosis Date  . Edema of foot    ankles swell  . Environmental allergies   . Hyperlipidemia   . Insomnia     Patient Active Problem List   Diagnosis Date Noted  . Upper airway cough syndrome 11/24/2015    Past Surgical History:  Procedure Laterality Date  . ELBOW SURGERY     crushed  . INCONTINENCE SURGERY    . PARTIAL HYSTERECTOMY    . TONSILLECTOMY  1966    Prior to Admission medications   Medication Sig Start Date End Date Taking? Authorizing Provider  ALPRAZolam Duanne Moron) 0.5 MG tablet Take 0.5 mg by mouth at bedtime as needed for anxiety.    Historical Provider, MD  benzonatate (TESSALON) 200 MG capsule Take 1 capsule (200 mg total) by mouth 3 (three) times daily as needed for cough. Patient not taking: Reported on 07/18/2016 11/24/15   Tanda Rockers, MD  Calcium Carbonate (OS-CAL PO) Take 1 tablet by mouth daily.    Historical Provider, MD  cephALEXin (KEFLEX) 500 MG capsule Take 1 capsule (500 mg total) by mouth 3 (three) times daily. 07/19/16   Paulette Blanch, MD  cetirizine (ZYRTEC) 10 MG tablet Take 10 mg by mouth  as needed for allergies. Reported on 10/26/2015    Historical Provider, MD  chlorpheniramine (CHLOR-TRIMETON) 4 MG tablet Take 4 mg by mouth at bedtime.    Historical Provider, MD  Cyanocobalamin (B-12) 1000 MCG/ML KIT Inject as directed.    Historical Provider, MD  famotidine (PEPCID) 20 MG tablet TAKE 1 TABLET BY MOUTH AT BEDTIME 05/03/16   Tanda Rockers, MD  furosemide (LASIX) 20 MG tablet Take 20 mg by mouth as needed.    Historical Provider, MD  HYDROcodone-acetaminophen (NORCO) 5-325 MG tablet Take 1 tablet by mouth every 6 (six) hours as needed for moderate pain. 07/19/16   Paulette Blanch, MD  levothyroxine (SYNTHROID, LEVOTHROID) 75 MCG tablet Take 75 mcg by mouth daily before breakfast.    Historical Provider, MD  ondansetron (ZOFRAN ODT) 4 MG disintegrating tablet Take 1 tablet (4 mg total) by mouth every 8 (eight) hours as needed for nausea or vomiting. 07/19/16   Paulette Blanch, MD  pantoprazole (PROTONIX) 40 MG tablet Take 1 tablet (40 mg total) by mouth daily. Take 30-60 min before first meal of the day 03/06/16   Tanda Rockers, MD  pravastatin (PRAVACHOL) 20 MG tablet Take 20 mg by mouth daily.    Historical Provider, MD  Vitamin D, Ergocalciferol, (DRISDOL) 50000 units CAPS capsule Take 50,000 Units by mouth every 7 (seven) days.    Historical Provider, MD    Allergies Patient has no known allergies.  Family  History  Problem Relation Age of Onset  . Colon cancer Father 3    deceased from heart disease  . Heart disease Father   . Heart disease Brother     Social History Social History  Substance Use Topics  . Smoking status: Never Smoker  . Smokeless tobacco: Never Used  . Alcohol use No    Review of Systems  Constitutional: No fever/chills. Eyes: No visual changes. ENT: No sore throat. Cardiovascular: Denies chest pain. Respiratory: Denies shortness of breath. Gastrointestinal: Positive for abdominal pain.  No nausea, no vomiting.  No diarrhea.  No  constipation. Genitourinary: Negative for dysuria. Musculoskeletal: Negative for back pain. Skin: Negative for rash. Neurological: Negative for headaches, focal weakness or numbness.  10-point ROS otherwise negative.  ____________________________________________   PHYSICAL EXAM:  VITAL SIGNS: ED Triage Vitals  Enc Vitals Group     BP 07/18/16 2033 134/86     Pulse Rate 07/18/16 2033 99     Resp 07/18/16 2033 18     Temp 07/18/16 2033 99.6 F (37.6 C)     Temp Source 07/18/16 2033 Oral     SpO2 07/18/16 2033 97 %     Weight 07/18/16 2034 164 lb (74.4 kg)     Height 07/18/16 2034 _0  (1.626 m)     Head Circumference --      Peak Flow --      Pain Score 07/18/16 2034 6     Pain Loc --      Pain Edu? --      Excl. in [place]? --     Constitutional: Alert and oriented. Well appearing and in no acute distress. Eyes: Conjunctivae are normal. PERRL. EOMI. Head: Atraumatic. Nose: No congestion/rhinnorhea. Mouth/Throat: Mucous membranes are moist.  Oropharynx non-erythematous. Neck: No stridor.   Cardiovascular: Normal rate, regular rhythm. Grossly normal heart sounds.  Good peripheral circulation. Respiratory: Normal respiratory effort.  No retractions. Lungs CTAB. Gastrointestinal: Soft and mildly tender to palpation epigastrium without rebound or guarding. No distention. No abdominal bruits. No CVA tenderness. Musculoskeletal: No lower extremity tenderness nor edema.  No joint effusions. Neurologic:  Normal speech and language. No gross focal neurologic deficits are appreciated. No gait instability. Skin:  Skin is warm, dry and intact. No rash noted. Psychiatric: Mood and affect are normal. Speech and behavior are normal.  ____________________________________________   LABS (all labs ordered are listed, but only abnormal results are displayed)  Labs Reviewed  COMPREHENSIVE METABOLIC PANEL - Abnormal; Notable for the following:       Result Value   Total Bilirubin 2.1 (*)     All other components within normal limits  URINALYSIS COMPLETEWITH MICROSCOPIC ([facility] ONLY) - Abnormal; Notable for the following:    Color, Urine YELLOW (*)    APPearance CLEAR (*)    Ketones, ur 1+ (*)    Specific Gravity, Urine 1.004 (*)    Hgb urine dipstick 1+ (*)    Leukocytes, UA 1+ (*)    Bacteria, UA MANY (*)    Squamous Epithelial / LPF 0-5 (*)    All other components within normal limits  CBC  LIPASE, BLOOD  TROPONIN I   ____________________________________________  EKG  ED ECG REPORT I, Somaya Grassi J, the attending physician, personally viewed and interpreted this ECG.   Date: 07/19/2016  EKG Time: 2044  Rate: 98  Rhythm: normal EKG, normal sinus rhythm  Axis: Normal  Intervals:none  ST&T Change: Nonspecific  ____________________________________________  RADIOLOGY  [place] interpreted per Dr. Gerilyn Nestle:  Cholelithiasis without evidence of cholecystitis. Large  multiloculated cystic mass in the left lobe of the liver. CT  recommended for additional characterization.   CT Abdomen/Pelvis interpreted per Dr. Gerilyn Nestle: 1. Large cystic structure in the left lobe of the liver, homogeneous  on CT but complex on ultrasound. 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  Could also consider cystic metastasis or amoebic or hydatid cysts in  the appropriate clinical setting.  2. Large complex cystic structure in the pelvis likely representing  a cystic ovarian lesion. Cystic ovarian neoplasm should be excluded.  Consider additional characterization with ultrasound. Surgical  consultation could also be considered.  3. Small amount of free fluid demonstrated in the abdomen and pelvis  of nonspecific etiology. Peritoneal tumor spread is not excluded.   ____________________________________________   PROCEDURES  Procedure(s) performed: None  Procedures  Critical Care performed: No  ____________________________________________   INITIAL IMPRESSION / ASSESSMENT AND PLAN / ED  COURSE  Pertinent labs & imaging results that were available during my care of the patient were reviewed by me and considered in my medical decision making (see chart for details).  70 year old female presenting with upper abdominal pain; similar episode 3 weeks prior. Initial laboratory results notable for elevated bilirubin and 1+ leukocytes with TNTC WBC on urinalysis. Will initiate IV analgesia and proceed with RUQ ultrasound to evaluate for cholecystitis.  Clinical Course as of Jul 19 549  Wed Jul 19, 2016  0120 Updated patient and spouse ultrasound results. Most likely patient's pain is secondary to cholelithiasis. However, given incidental finding of liver mass, will proceed with CT abdomen/pelvis for better characterization.  [JS]  0330 Discussed CT findings with patient and spouse. She is not having any pelvic discomfort; pelvic ultrasound may be performed on an outpatient basis. Will have her follow-up with GYN for further evaluation of pelvic mass. She will also have contact information to follow up with general surgery for further evaluation of cholelithiasis. She will follow up with her PCP to coordinate her specialty care and to refer her for further evaluation of liver mass. Strict return precautions given. Both verbalize understanding and agree with plan of care.  [JS]    Clinical Course User Index [JS] Paulette Blanch, MD     ____________________________________________   FINAL CLINICAL IMPRESSION(S) / ED DIAGNOSES  Final diagnoses:  Pain of upper abdomen  Calculus of gallbladder without cholecystitis without obstruction  Liver mass  Pelvic mass  Lower urinary tract infectious disease      NEW MEDICATIONS STARTED DURING THIS VISIT:  Discharge Medication List as of 07/19/2016  3:19 AM    START taking these medications   Details  HYDROcodone-acetaminophen (NORCO) 5-325 MG tablet Take 1 tablet by mouth every 6 (six) hours as needed for moderate pain., Starting Wed  07/19/2016, Print    ondansetron (ZOFRAN ODT) 4 MG disintegrating tablet Take 1 tablet (4 mg total) by mouth every 8 (eight) hours as needed for nausea or vomiting., Starting Wed 07/19/2016, Print         Note:  This document was prepared using Dragon voice recognition software and may include unintentional dictation errors.    Paulette Blanch, MD 07/19/16 [id number]

## 2016-07-19 ENCOUNTER — Emergency Department: Payer: BC Managed Care – PPO

## 2016-07-19 DIAGNOSIS — K8020 Calculus of gallbladder without cholecystitis without obstruction: Secondary | ICD-10-CM | POA: Diagnosis not present

## 2016-07-19 MED ORDER — HYDROCODONE-ACETAMINOPHEN 5-325 MG PO TABS: 1.0000 | ORAL_TABLET | Freq: Four times a day (QID) | ORAL | 0 refills | Status: DC | PRN

## 2016-07-19 MED ORDER — IOPAMIDOL (ISOVUE-300) INJECTION 61%
30.0000 mL | Freq: Once | INTRAVENOUS | Status: AC | PRN
  Administered 2016-07-19: 30 mL via ORAL

## 2016-07-19 MED ORDER — CEPHALEXIN 500 MG PO CAPS: 500.0000 mg | ORAL_CAPSULE | Freq: Three times a day (TID) | ORAL | 0 refills | Status: DC

## 2016-07-19 MED ORDER — ONDANSETRON 4 MG PO TBDP
4.0000 mg | ORAL_TABLET | Freq: Once | ORAL | Status: AC
  Administered 2016-07-19: 4 mg via ORAL

## 2016-07-19 MED ORDER — HYDROCODONE-ACETAMINOPHEN 5-325 MG PO TABS
ORAL_TABLET | ORAL | Status: AC
  Administered 2016-07-19: 1 via ORAL
  Filled 2016-07-19: qty 1

## 2016-07-19 MED ORDER — ONDANSETRON 4 MG PO TBDP
ORAL_TABLET | ORAL | Status: AC
  Administered 2016-07-19: 4 mg via ORAL
  Filled 2016-07-19: qty 1

## 2016-07-19 MED ORDER — HYDROCODONE-ACETAMINOPHEN 5-325 MG PO TABS
1.0000 | ORAL_TABLET | Freq: Once | ORAL | Status: AC
  Administered 2016-07-19: 1 via ORAL

## 2016-07-19 MED ORDER — IOPAMIDOL (ISOVUE-300) INJECTION 61%
100.0000 mL | Freq: Once | INTRAVENOUS | Status: AC | PRN
  Administered 2016-07-19: 100 mL via INTRAVENOUS

## 2016-07-19 MED ORDER — CEPHALEXIN 500 MG PO CAPS
500.0000 mg | ORAL_CAPSULE | Freq: Once | ORAL | Status: AC
  Administered 2016-07-19: 500 mg via ORAL
  Filled 2016-07-19: qty 1

## 2016-07-19 MED ORDER — ONDANSETRON 4 MG PO TBDP: 4.0000 mg | ORAL_TABLET | Freq: Three times a day (TID) | ORAL | 0 refills | Status: DC | PRN

## 2016-07-19 NOTE — Discharge Instructions (Signed)
1. Your CT scan is concerning for a mass in your pelvis and liver. Please follow up with the specialists as directed. 2. You may take pain and nausea medicines as needed (Norco/Zofran). 3. Eat a bland diet until seen by the surgeon.  4. Take antibiotic as prescribed (Keflex 500mg  3 times daily x 7 days). 5. Return to the ER for worsening symptoms, persistent vomiting, difficulty breathing or other concerns.

## 2016-07-19 NOTE — ED Notes (Signed)
Pt finished with contrast. CT notified. 

## 2016-07-26 ENCOUNTER — Other Ambulatory Visit: Payer: Self-pay | Admitting: Internal Medicine

## 2016-08-15 ENCOUNTER — Telehealth: Payer: Self-pay | Admitting: Internal Medicine

## 2016-08-15 NOTE — Telephone Encounter (Signed)
Spoke with the pt and scheduled acute visit with TP to eval cough

## 2016-08-17 ENCOUNTER — Encounter: Payer: Self-pay | Admitting: Adult Health

## 2016-08-17 ENCOUNTER — Ambulatory Visit (INDEPENDENT_AMBULATORY_CARE_PROVIDER_SITE_OTHER): Payer: BC Managed Care – PPO | Admitting: Adult Health

## 2016-08-17 ENCOUNTER — Ambulatory Visit (INDEPENDENT_AMBULATORY_CARE_PROVIDER_SITE_OTHER)
Admission: RE | Admit: 2016-08-17 | Discharge: 2016-08-17 | Disposition: A | Discharge status: Home / Self Care | Payer: BC Managed Care – PPO | Source: Ambulatory Visit | Attending: Adult Health | Admitting: Adult Health

## 2016-08-17 VITALS — BP 118/88 | HR 83 | Ht 62.5 in | Wt 163.2 lb

## 2016-08-17 DIAGNOSIS — R05 Cough: Secondary | ICD-10-CM

## 2016-08-17 DIAGNOSIS — R058 Other specified cough: Secondary | ICD-10-CM

## 2016-08-17 DIAGNOSIS — R059 Cough, unspecified: Secondary | ICD-10-CM

## 2016-08-17 MED ORDER — FAMOTIDINE 20 MG PO TABS: 20.0000 mg | ORAL_TABLET | Freq: Every day | ORAL | 0 refills | Status: DC

## 2016-08-17 MED ORDER — PANTOPRAZOLE SODIUM 40 MG PO TBEC: 40.0000 mg | DELAYED_RELEASE_TABLET | Freq: Every day | ORAL | 0 refills | Status: DC

## 2016-08-17 MED ORDER — HYDROCODONE-HOMATROPINE 5-1.5 MG/5ML PO SYRP
5.0000 mL | ORAL_SOLUTION | Freq: Four times a day (QID) | ORAL | 0 refills | Status: AC | PRN
Start: 2016-08-17 — End: ?

## 2016-08-17 NOTE — Patient Instructions (Addendum)
Restart Protonix 40mg  daily in am .  Restart Pepcid 20mg  At bedtime   May use Delsym 2 tsp Twice daily  As needed  Cough  Hydromet 1 tsp every 6hr As needed severe cough, may make you sleepy.  Chest xray today  Follow up Dr. Melvyn Novas  As planned and As needed   Please contact office for sooner follow up if symptoms do not improve or worsen or seek emergency care  Good luck with your upcoming surgery .

## 2016-08-17 NOTE — Progress Notes (Signed)
Subjective:    Patient ID: Rachel Hays, female    DOB: 11-14-1945,     MRN: [id number]    Brief patient profile:  [id number] never a regular smoker new onset runny eyes x 2014 typically starts in oct and goes on all winter until spring assoc with daily cough year round since 2013 self referred to pulmonary clinic 11/24/2015    History of Present Illness  11/24/2015 1st [facility] office visit/ [facility]   Chief Complaint  Patient presents with  . Pulmonary Consult    Self referral for chronic cough x 2 years. Pt c/o dry cough and mild PND. Pt denies SOB and CP/tightness.   cough is worse at meals and seems more day than noct and rarely wakes her up / rhinitis better with pred but not the cough Zyrtec>  no improvement even in  the sensation of drainage      Kouffman Reflux v Neurogenic Cough Differentiator Reflux Comments  Do you awaken from a sound sleep coughing violently?                            With trouble breathing? no   Do you have choking episodes when you cannot  Get enough air, gasping for air ?              no   Do you usually cough when you lie down into  The bed, or when you just lie down to rest ?                          sometimes   Do you usually cough after meals or eating?         Yes   Do you cough when (or after) you bend over?    Yes   GERD SCORE     Kouffman Reflux v Neurogenic Cough Differentiator Neurogenic   Do you more-or-less cough all day long? sporadic   Does change of temperature make you cough? No    Does laughing or chuckling cause you to cough? no   Do fumes (perfume, automobile fumes, burned  Toast, etc.,) cause you to cough ?      no   Does speaking, singing, or talking on the phone cause you to cough   ?               No    Neurogenic/Airway score    rec Please remember to go to the lab  department downstairs for your tests - we will call you with the results when they are available. Pantoprazole (protonix) 40 mg   Take  30-60 min  before first meal of the day and Pepcid (famotidine)  20 mg one @  bedtime until return to office - this is the best way to tell whether stomach acid is contributing to your problem.   GERD diet  Cough > tessalon pearl 200 mg  one every 8 hours until no longer coughing at all  For drainage since you don't thing zyrtec is working > For drainage / throat tickle try take CHLORPHENIRAMINE  4 mg - take one every 4 hours as needed - available over the counter- may cause drowsiness so start with just a bedtime dose or two and see how you tolerate it before trying in daytime      12/15/2015  f/u ov/Wert re: chronic cough / pnds  Chief Complaint  Patient presents with  . Follow-up    Cough has improved some. Still has minimal PND. No new co's today.   only taking h1 at hs seems to work well, no longer needing tessilon  >>>Chortrimeton   08/17/2016 Follow up : Chronic Cough  Pt returns for cough flare . She says she to has had a  cough flare up over last several weeks. She is worried because she is going to be having  surgery for ovarian mass and gallbladder next week. Previously she says her cough improved greatly when she took protonix/pepcid and chlortrimeton.  She denies fever, discolored mucus , hemoptysis , chest pain, orthopnea, edema , calf pain , dyspnea or weight loss.  She does have nasal drainage , taking chlortrimeton.     Current Medications, Allergies, Complete Past Medical History, Past Surgical History, Family History, and Social History were reviewed in [employer] record.  ROS  The following are not active complaints unless bolded sore throat, dysphagia, dental problems, itching, sneezing,  nasal congestion or excess/ purulent secretions, ear ache,   fever, chills, sweats, unintended wt loss, classically pleuritic or exertional cp, hemoptysis,  orthopnea pnd or leg swelling, presyncope, palpitations, abdominal pain, anorexia, nausea, vomiting, diarrhea  or  change in bowel or bladder habits, change in stools or urine, dysuria,hematuria,  rash, arthralgias, visual complaints, headache, numbness, weakness or ataxia or problems with walking or coordination,  change in mood/affect or memory.                  Objective:   Physical Exam   amb wf nad   Vitals:   08/17/16 1443  BP: 118/88  Pulse: 83  SpO2: 93%  Weight: 163 lb 3.2 oz (74 kg)  Height: 5' 2.5" (1.588 m)      Vital signs reviewed    HEENT: nl dentition, turbinates, and oropharynx nml  Nl external ear canals without cough reflex   NECK :  without JVD/Nodes/TM/ nl carotid upstrokes bilaterally   LUNGS: no acc muscle use,  Nl contour chest which is clear to A and P bilaterally with variable cough urge at end exp    CV:  RRR  no s3 or murmur or increase in P2, no edema   ABD:  soft and nontender with nl inspiratory excursion in the supine position. No bruits or organomegaly, bowel sounds nl  MS:  Nl gait/ ext warm without deformities, calf tenderness, cyanosis or clubbing No obvious joint restrictions   SKIN: warm and dry without lesions    NEURO:  alert, approp, nl sensorium with  no motor deficits    cxr 3/ 17/16 cxr brought in by pt > wnl   Tatayana Beshears NP-C  [facility]  08/18/2016

## 2016-08-18 NOTE — Assessment & Plan Note (Addendum)
Flare w/ AR ? GERD  Check cxr   Plan  Patient Instructions  Restart Protonix 40mg  daily in am .  Restart Pepcid 20mg  At bedtime   May use Delsym 2 tsp Twice daily  As needed  Cough  Hydromet 1 tsp every 6hr As needed severe cough, may make you sleepy.  Chest xray today  Follow up Dr. Melvyn Novas  As planned and As needed   Please contact office for sooner follow up if symptoms do not improve or worsen or seek emergency care  Good luck with your upcoming surgery .

## 2016-08-18 NOTE — Progress Notes (Signed)
Chart and office note reviewed in detail  > agree with a/p as outlined    

## 2016-08-20 NOTE — Progress Notes (Signed)
Chart and office note reviewed in detail  > agree with a/p as outlined    

## 2016-09-28 ENCOUNTER — Ambulatory Visit: Payer: Medicare Other | Admitting: Internal Medicine

## 2016-11-12 ENCOUNTER — Other Ambulatory Visit: Payer: Self-pay | Admitting: Adult Health

## 2018-01-25 IMAGING — CT CT ABD-PELV W/ CM
2 of 5 series · 15 of 46 positions shown, 17 images · IV contrast (APPLIED)
Comparison: None.

CLINICAL DATA: Upper abdominal pain since this morning. Similar
pain episode 3 weeks ago. Ultrasound demonstrated mass in the left
lobe of liver.

EXAM:
CT ABDOMEN AND PELVIS WITH CONTRAST
TECHNIQUE: Multidetector CT imaging of the abdomen and pelvis was performed
using the standard protocol following bolus administration of
intravenous contrast.
CONTRAST:  100mL [id number] IOPAMIDOL ([id number]) INJECTION 61%

[Series 2: axial st · axial · 0.81mm/px · z∈[-986,-556]mm · 12 of 98 slices shown, 14 images]
[im 6/98  soft-tissue]
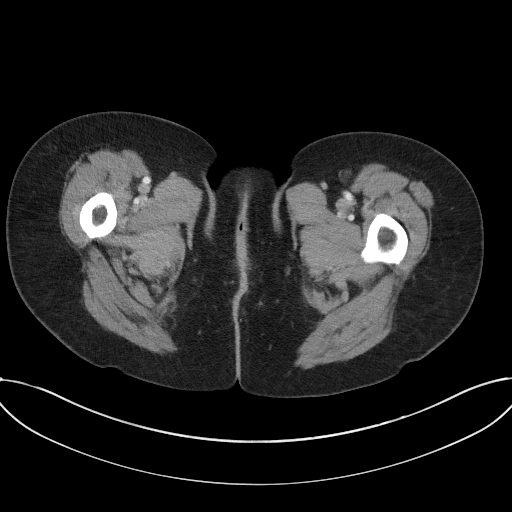
[im 6/98  bone]
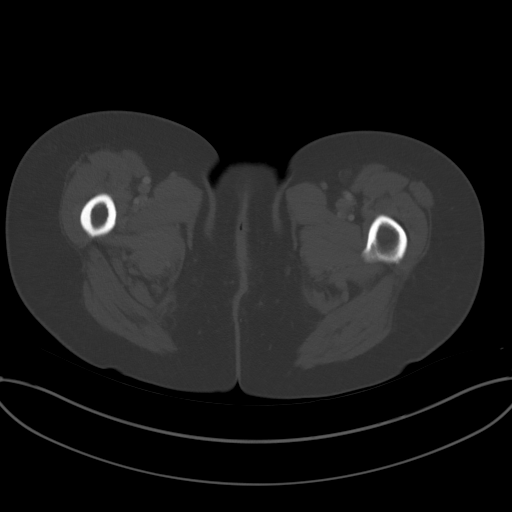
[im 18/98  soft-tissue]
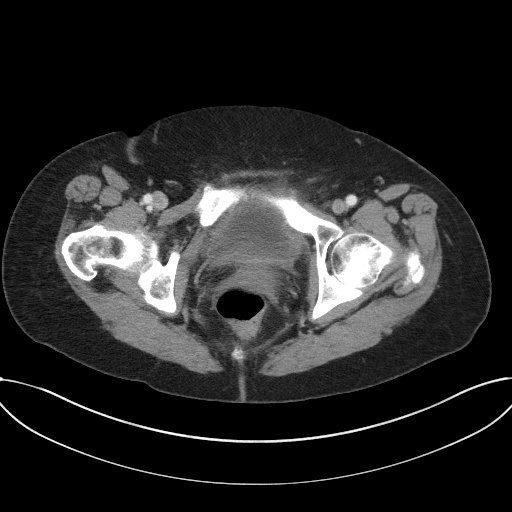
[im 23/98  soft-tissue]
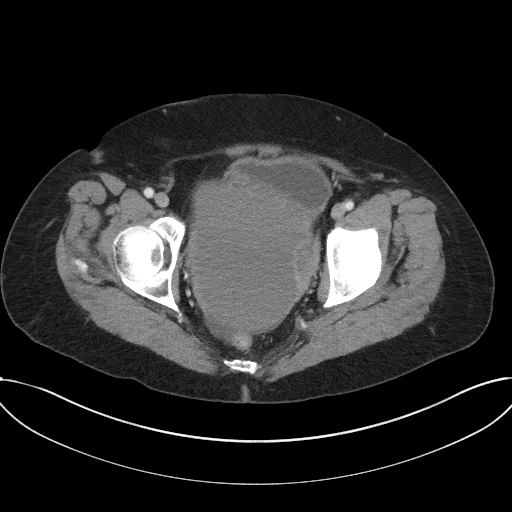
[im 29/98  soft-tissue]
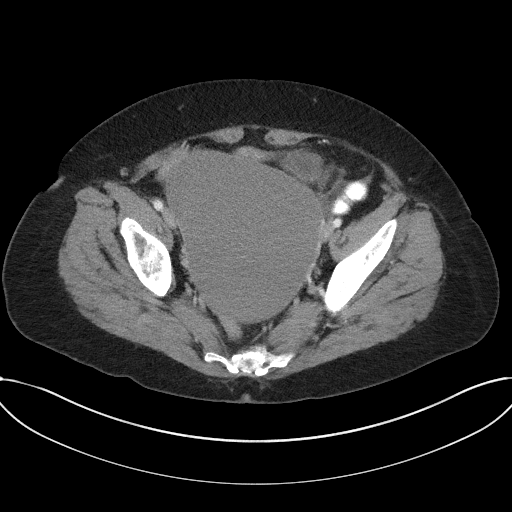
[im 40/98  soft-tissue]
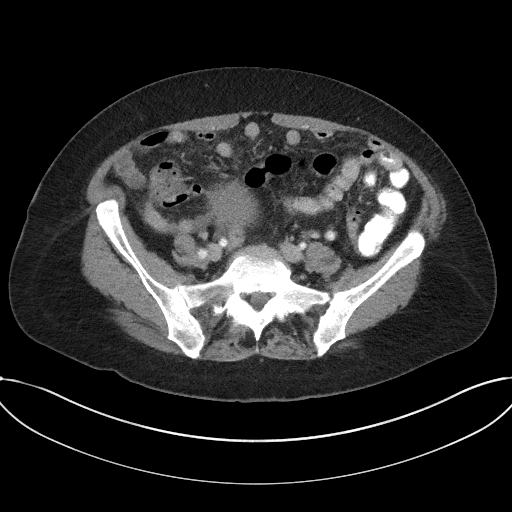
[im 46/98  soft-tissue]
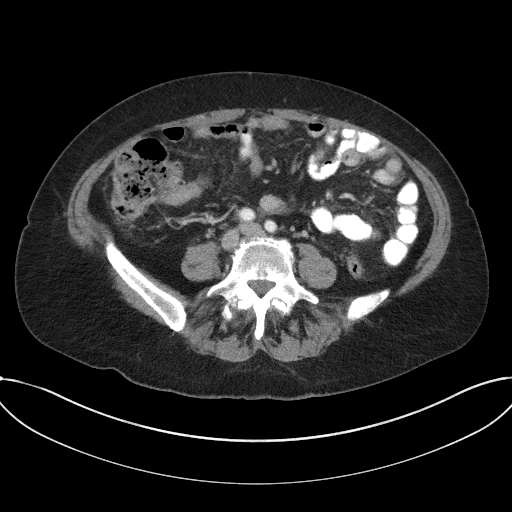
[im 52/98  soft-tissue]
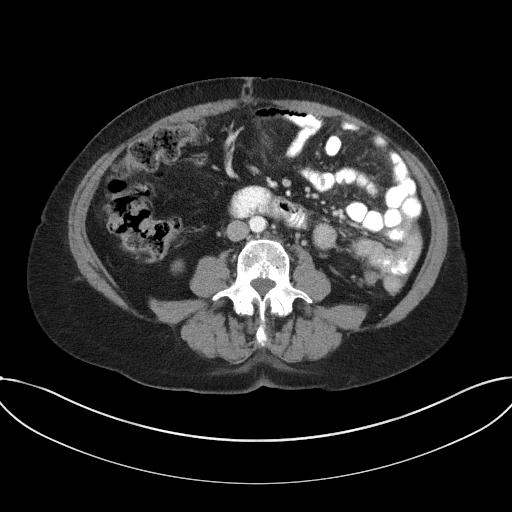
[im 63/98  soft-tissue]
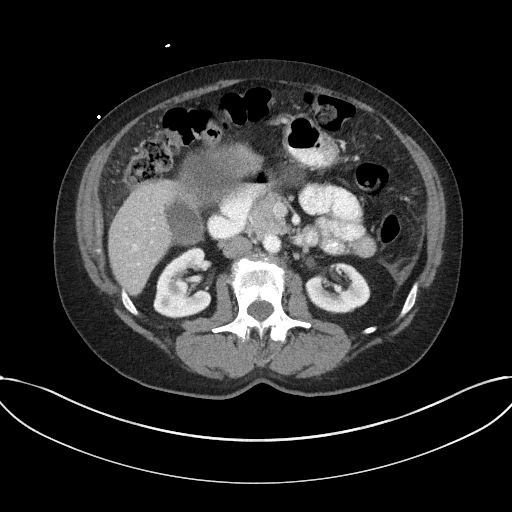
[im 69/98  soft-tissue]
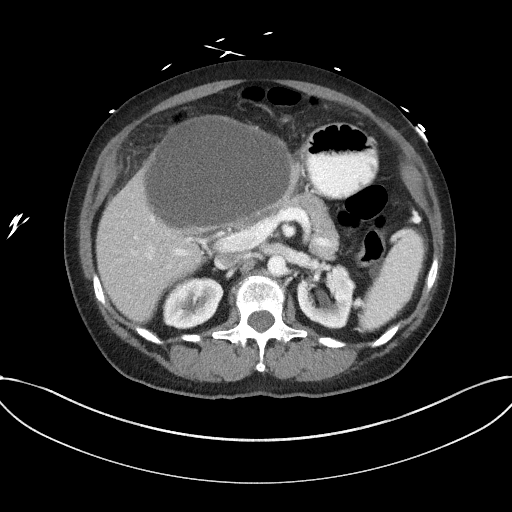
[im 69/98  bone]
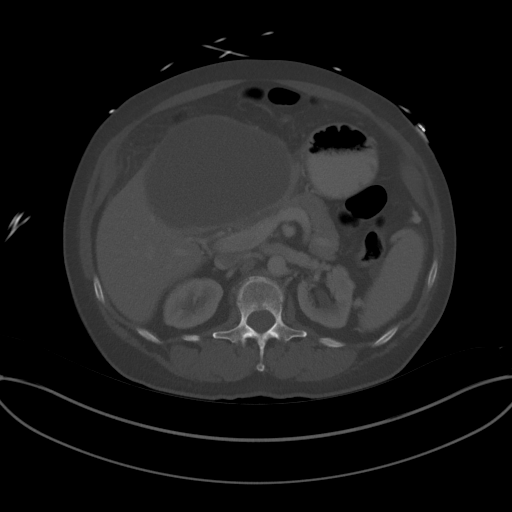
[im 75/98  soft-tissue]
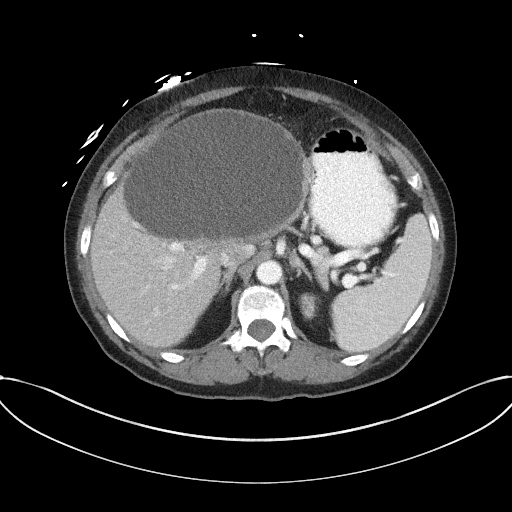
[im 86/98  soft-tissue]
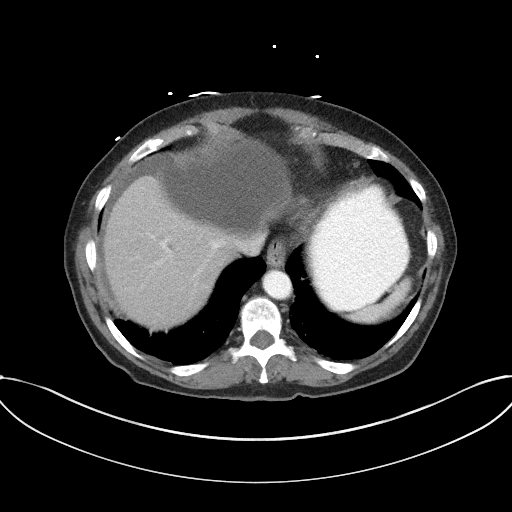
[im 92/98  soft-tissue]
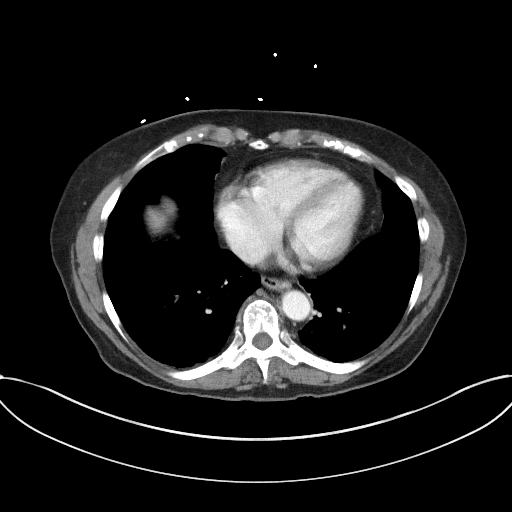

[Series 5: coronal st · coronal · 0.73mm/px · 3 of 88 slices shown]
[im 30/88  soft-tissue]
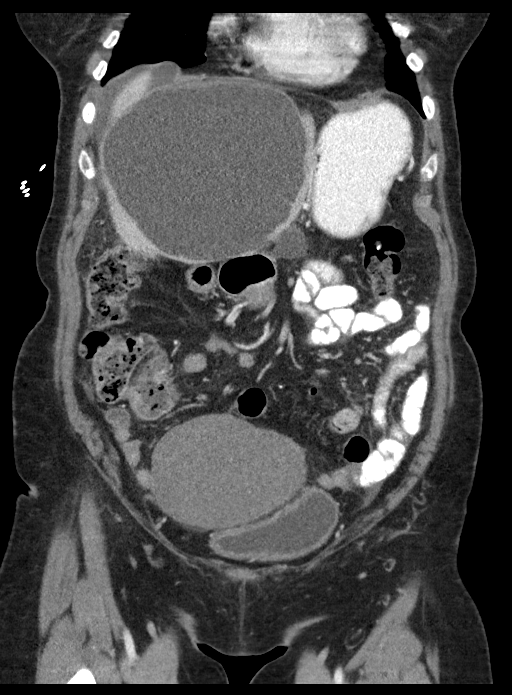
[im 39/88  soft-tissue]
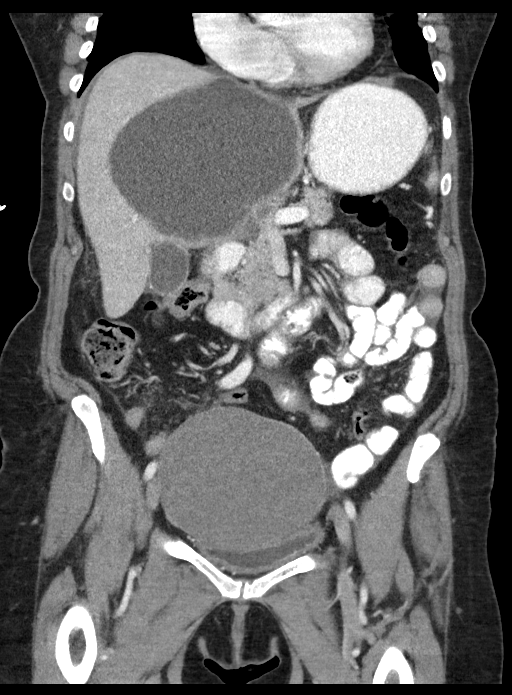
[im 49/88  soft-tissue]
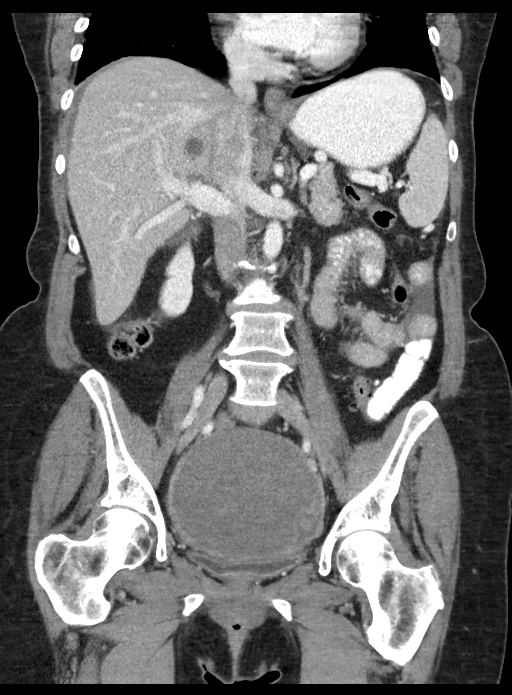

[15 of 46 positions shown; findings below may reference images not displayed]

FINDINGS: Lower chest: Dependent atelectasis in the lung bases. Small amount
of residual contrast material in the esophagus without distention.
This may indicate reflux or dysmotility.

Hepatobiliary: There is a large homogeneous low-attenuation mass
lesion demonstrated filling the left lobe of the liver and measuring
about 10.6 x 15.1 cm in diameter. The CT appearance is homogeneous
but on ultrasound, this lesion was found to be multiloculated. The
wall is thin and no peripheral enhancement is demonstrated. No solid
component, mural nodularity, or calcifications. Appearance is likely
to represent biliary cystadenoma. Cystic metastasis would be less
likely. In the appropriate clinical setting, could also consider
amoebic cyst or hydatid cysts. Gallbladder and bile ducts are
unremarkable.

Pancreas: Unremarkable. No pancreatic ductal dilatation or
surrounding inflammatory changes.

Spleen: Normal in size without focal abnormality.

Adrenals/Urinary Tract: Adrenal glands are unremarkable. Kidneys are
normal, without renal calculi, focal lesion, or hydronephrosis.
Bladder is unremarkable.

Stomach/Bowel: Stomach is within normal limits. Appendix appears
normal. No evidence of bowel wall thickening, distention, or
inflammatory changes.

Vascular/Lymphatic: No significant vascular findings are present. No
enlarged abdominal or pelvic lymph nodes.

Reproductive: Uterus is surgically absent. There is a large complex
cystic lesion filling the pelvis, likely arising from the adnexal
regions. The mass measures 11.6 x 14.2 cm. Peripheral septations are
present. Cystic ovarian neoplasm should be excluded.

Other: Small amount of free fluid demonstrated in the upper abdomen,
along the pericolic gutters, and in the pelvis. No free air.

Musculoskeletal: Degenerative changes in the spine. No destructive
bone lesions.
IMPRESSION: 1. Large cystic structure in the left lobe of the liver, homogeneous
on CT but complex on ultrasound. Most likely biliary cystadenoma.
Could also consider cystic metastasis or amoebic or hydatid cysts in
the appropriate clinical setting.
2. Large complex cystic structure in the pelvis likely representing
a cystic ovarian lesion. Cystic ovarian neoplasm should be excluded.
Consider additional characterization with ultrasound. Surgical
consultation could also be considered.
3. Small amount of free fluid demonstrated in the abdomen and pelvis
of nonspecific etiology. Peritoneal tumor spread is not excluded.

## 2018-02-23 IMAGING — DX DG CHEST 2V
2 series · 2 of 2 positions shown · non-contrast
Comparison: No recent prior .

CLINICAL DATA: Cough.

EXAM:
CHEST  2 VIEW

[chest pa]
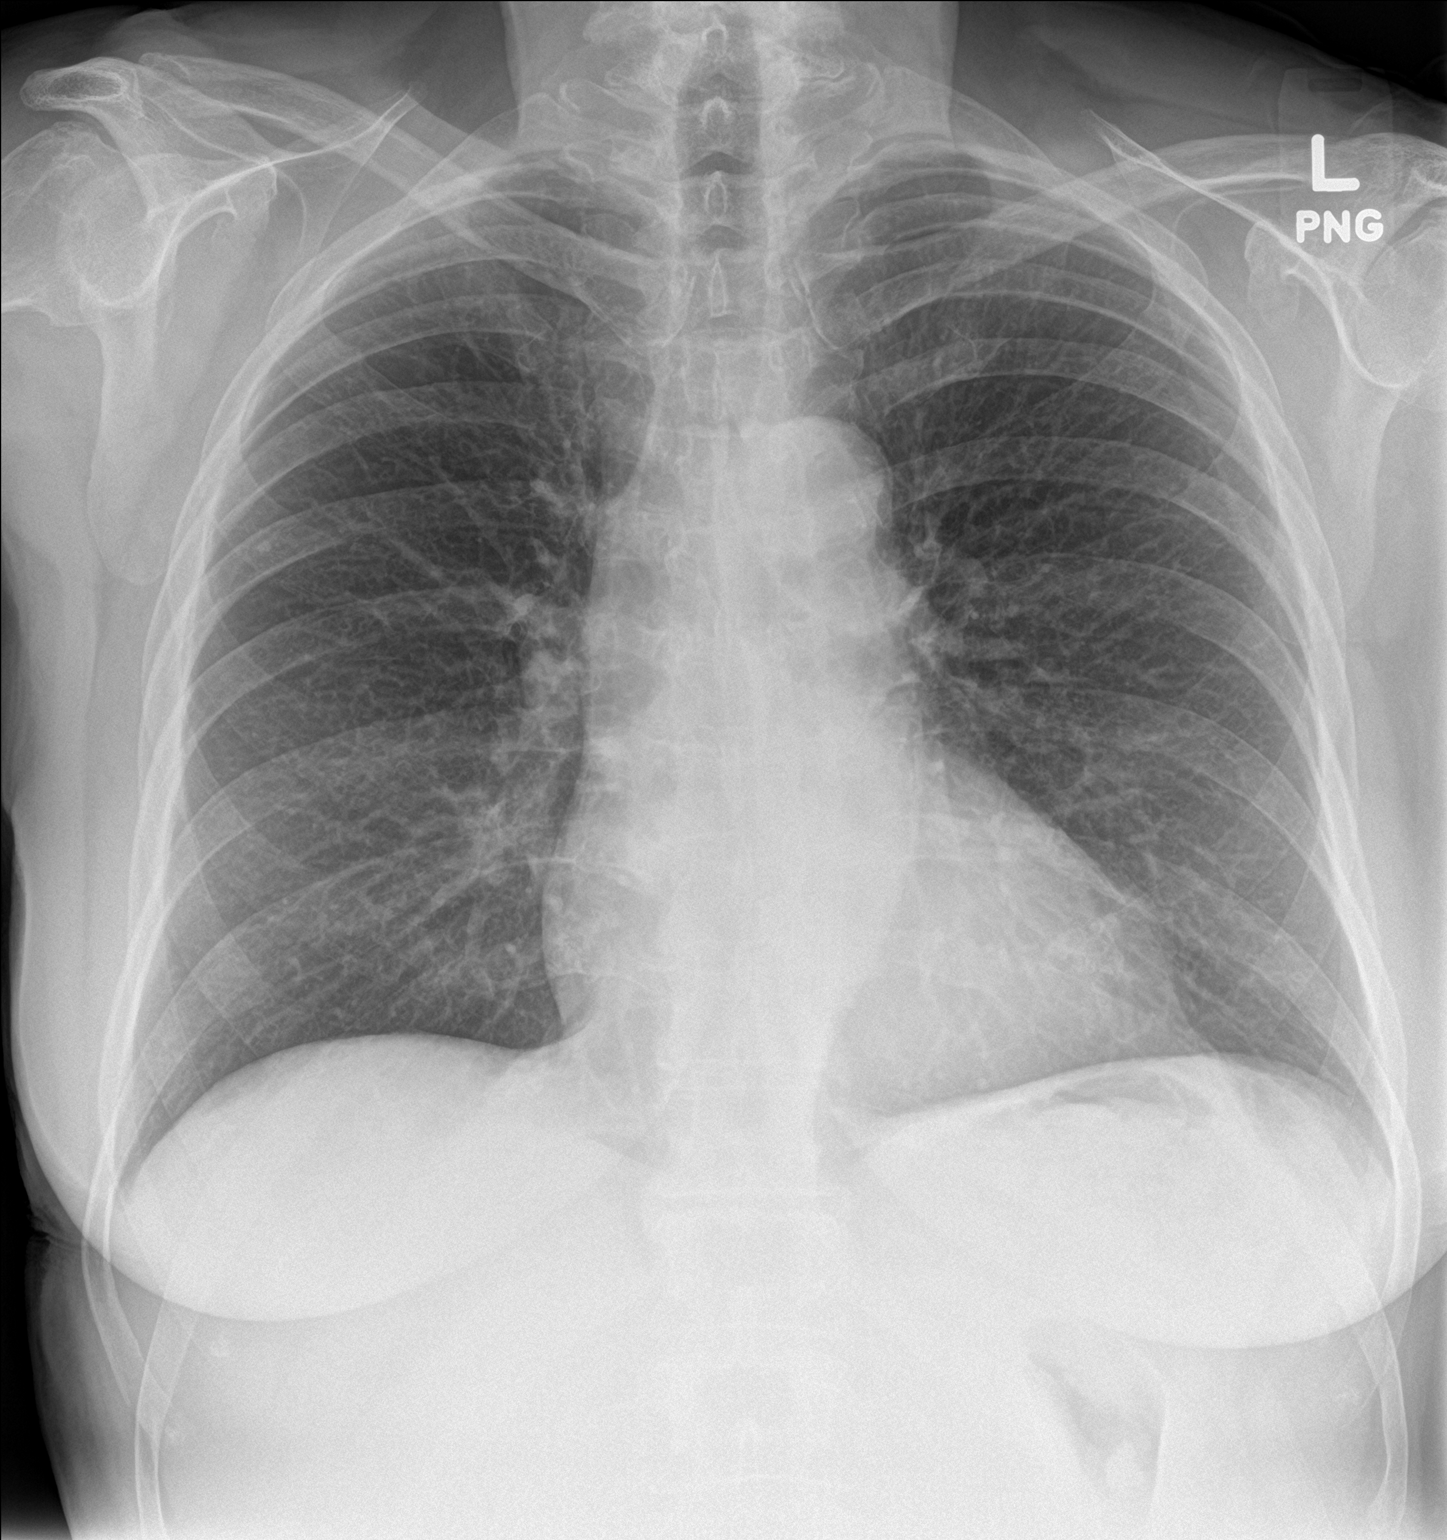

[chest lat]
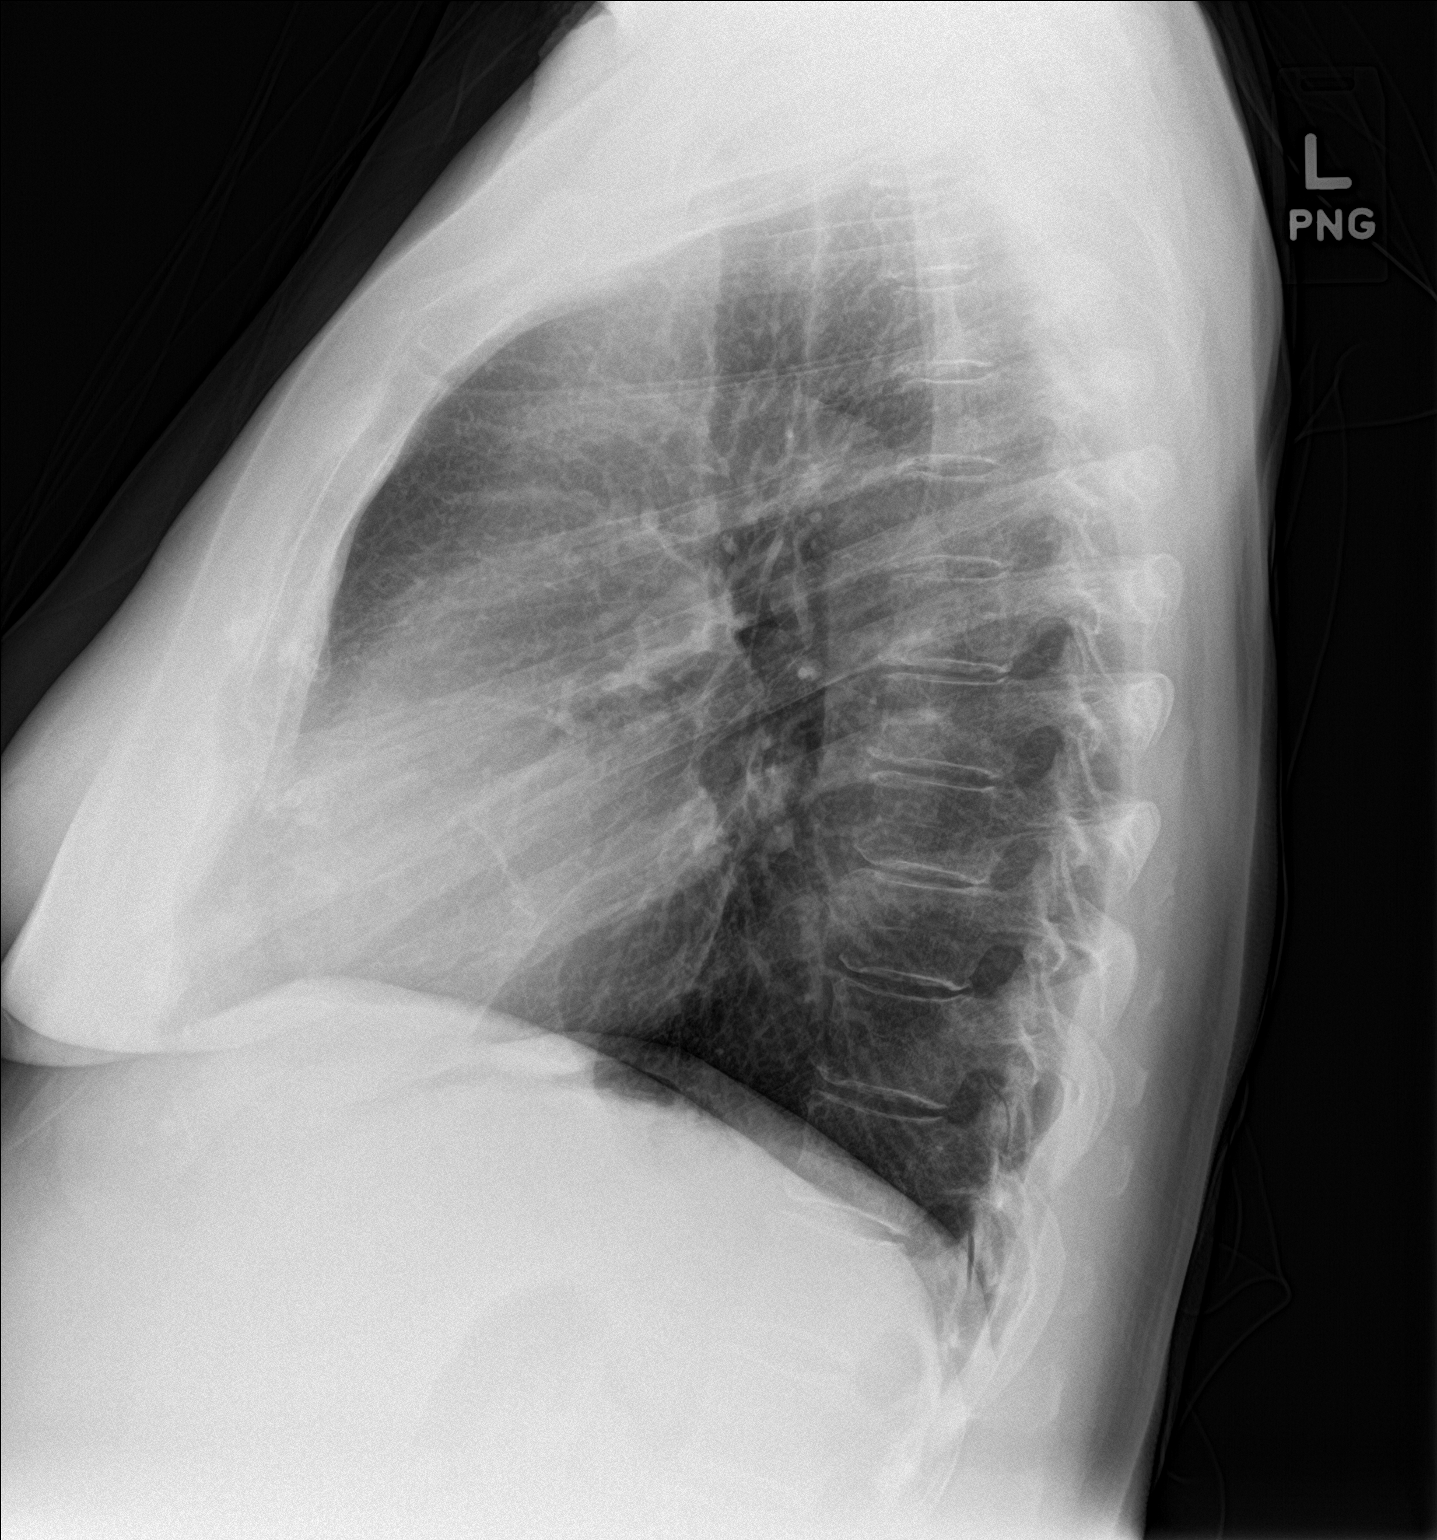

[2 of 2 positions shown; findings below may reference images not displayed]

FINDINGS: Mediastinum hilar structures normal. Mild cardiomegaly. No pulmonary
venous congestion. No focal infiltrate. No pleural effusion or
pneumothorax. Tiny calcified pulmonary nodule right upper lobe
consistent granuloma.
IMPRESSION: Mild cardiomegaly. No evidence of CHF. No focal pulmonary
infiltrate.

## 2021-03-17 ENCOUNTER — Other Ambulatory Visit: Payer: Self-pay | Admitting: General Surgery

## 2021-03-17 NOTE — Progress Notes (Signed)
Subjective:     Patient ID: Rachel Hays is a 75 y.o. female.   HPI   The following portions of the patient's history were reviewed and updated as appropriate.   This an established patient is here today for: office visit. The patient is here today to discuss a colonoscopy. Her last colonoscopy was done by Dr. Scarlette Shorts on 02-14-10. Patient reports she has bowel movements every other day. She denies any rectal bleeding or mucous.          Chief Complaint  Patient presents with   Colonoscopy      discussion      BP 122/78   Pulse 70   Temp 36.1 C (97 F)   Ht 160 cm ($Remov'5\' 3"'GnJobD$ )   Wt 71.7 kg (158 lb)   SpO2 93%   BMI 27.99 kg/m        Past Medical History:  Diagnosis Date   Anxiety disorder, unspecified type     Hyperlipidemia, acquired     Hypothyroidism (acquired), unspecified     Hypothyroidism (acquired), unspecified     Vitamin B12 deficiency             Past Surgical History:  Procedure Laterality Date   bladder sling       CHOLECYSTECTOMY N/A 08/25/2016    Procedure: CHOLECYSTECTOMY;  Surgeon: Thayer Ohm., MD;  Location: [facility];  Service: General Surgery;  Laterality: N/A;   CHOLECYSTECTOMY       COLONOSCOPY   2005   COLONOSCOPY   2001   COLONOSCOPY   02/14/2010    Dr Scarlette Shorts   EXPLORATORY LAPAROTOMY N/A 08/25/2016    Procedure: EXPLORATORY LAPAROTOMY, EXPLORATORY CELIOTOMY WITH OR WITHOUT BIOPSY(S) (SEPARATE PROCEDURE);  Surgeon: Henrietta Hoover, MD;  Location: [facility];  Service: Gynecology;  Laterality: N/A;   HYSTERECTOMY   1979    partial   OOPHORECTOMY   2017   RESECTION LIVER FOR PARTIAL LOBECTOMY N/A 12/27/2016    Procedure: RESECTION LIVER FOR PARTIAL LOBECTOMY - open left hepatectomy;  Surgeon: Thayer Ohm., MD;  Location: [facility];  Service: General Surgery;  Laterality: N/A;  open left hepatectomy     RESECTION OVARIAN MALIGNANCY W/RADICAL DISSECTION N/A 08/25/2016    Procedure: RESECTION (INITIAL) OF OVARIAN,  TUBAL OR PRIMARY PERITONEAL MALIGNANCY, OMENTECTOMY; WITH RADICAL DISSECTION FOR DEBULKING;  Surgeon: Henrietta Hoover, MD;  Location: [facility];  Service: Gynecology;  Laterality: N/A;  likelihood <30%   SALPINGO OOPHORECTOMY Bilateral 08/25/2016    Procedure: SALPINGO-OOPHORECTOMY, COMPLETE OR PARTIAL, UNILATERAL OR BILATERAL (SEPARATE PROCEDURE);  Surgeon: Henrietta Hoover, MD;  Location: [facility];  Service: Gynecology;  Laterality: Bilateral;   SLING FOR STRESS INCONTINENCE   1980's   TONSILLECTOMY   1966                OB History     Gravida  3   Para  3   Term      Preterm      AB      Living  2      SAB      IAB      Ectopic      Molar      Multiple      Live Births           Obstetric Comments  Age at first period 14 Age of first pregnancy 76  Social History          Socioeconomic History   Marital status: Married  Tobacco Use   Smoking status: Former Smoker   Smokeless tobacco: Never Used   Tobacco comment: smoked 1-2 yrs in high school  Vaping Use   Vaping Use: Never used  Substance and Sexual Activity   Alcohol use: No   Drug use: No   Sexual activity: Yes        No Known Allergies   Current Medications        Current Outpatient Medications  Medication Sig Dispense Refill   ALPRAZolam (XANAX) 0.5 MG tablet Take 0.5 mg by mouth nightly as needed.          cyanocobalamin, vitamin B-12, 1,000 mcg/mL Kit Inject as directed. Once a month.       ergocalciferol, vitamin D2, 50,000 unit capsule Take 50,000 Units by mouth once a week.          FLUoxetine (PROZAC) 20 MG capsule         levothyroxine (SYNTHROID, LEVOTHROID) 75 MCG tablet Take 75 mcg by mouth once daily.          pravastatin (PRAVACHOL) 20 MG tablet Take by mouth every other day.       aspirin 81 MG EC tablet Take 81 mg by mouth once daily (Patient not taking: No sig reported)       azelastine (OPTIVAR) 0.05 % ophthalmic solution 1 drop 2  (two) times daily (Patient not taking: No sig reported)       desonide (DESOWEN) 0.05 % cream Apply to affected areas twice a day until smooth. (Patient not taking: No sig reported) 60 g 2   FUROsemide (LASIX) 20 MG tablet Take 20 mg by mouth once daily as needed.   (Patient not taking: No sig reported)       oxybutynin (DITROPAN-XL) 5 MG XL tablet TAKE 1 TABLET BY MOUTH EVERY DAY (Patient not taking: No sig reported) 90 tablet 0   tacrolimus (PROTOPIC) 0.1 % ointment After 3 days of use of Desonide, apply to affected areas 2 x day. Can follow this application with desonide. (Patient not taking: No sig reported) 30 g 3    No current facility-administered medications for this visit.             Family History  Problem Relation Age of Onset   High blood pressure (Hypertension) Mother     Osteoarthritis Mother     Stroke Mother     Heart disease Father     Coronary Artery Disease (Blocked arteries around heart) Father     Colon cancer Father 65   Myocardial Infarction (Heart attack) Father     Heart disease Brother     Pancreatic cancer Maternal Uncle 12   Cancer Paternal Aunt          GYN - survived   Breast cancer Cousin          maternal   Anesthesia problems Neg Hx     Malignant hypertension Neg Hx            Review of Systems  Constitutional: Negative for chills and fever.  Respiratory: Negative for cough.          Objective:   Physical Exam Exam conducted with a chaperone present.  Constitutional:      Appearance: Normal appearance.  Cardiovascular:     Rate and Rhythm: Normal rate and regular rhythm.     Pulses: Normal pulses.  Heart sounds: Normal heart sounds.  Pulmonary:     Effort: Pulmonary effort is normal.     Breath sounds: Normal breath sounds.  Abdominal:     General: Abdomen is flat.     Palpations: Abdomen is soft.     Tenderness: There is no abdominal tenderness.     Hernia: No hernia is present.    Musculoskeletal:     Cervical back:  Neck supple.  Skin:    General: Skin is warm and dry.  Neurological:     Mental Status: She is alert and oriented to person, place, and time.  Psychiatric:        Mood and Affect: Mood normal.        Behavior: Behavior normal.        Labs and Radiology:    October 26, 2015 colonoscopy:   Report completed by Scarlette Shorts, MD reviewed.  2 small polyps.   Pathology: 1. Surgical [P], ascending, polyp - BENIGN COLONIC MUCOSA WITH A SMALL LYMPHOID AGGREGATE. NO ADENOMATOUS EPITHELIUM OR MALIGNANCY IDENTIFIED. 2. Surgical [P], descending, polyp HYPERPLASTIC POLYP. NO ADENOMATOUS CHANGE OR MALIGNANCY IDENTIFIED.   Operative report from December 27, 2016 for partial hepatectomy for hepatic cyst reviewed.   Operative report from August 25, 2016 exploratory laparotomy for suspected ovarian cancer reviewed.    Assessment:     Candidate for repeat colonoscopy based on family history.   The patient reported good satisfaction with Dr. Henrene Pastor, but a decision to have the procedure closer to home.    Plan:     The patient was instructed in regards to preparation for the procedure by the staff.   Patient to call the office back to schedule her colonoscopy at a convenient date.     This note is partially prepared by Ledell Noss, CMA acting as a scribe in the presence of Dr. Hervey Ard, MD.    The documentation recorded by the scribe accurately reflects the service I personally performed and the decisions made by me.    Robert Bellow, MD FACS

## 2021-03-24 ENCOUNTER — Encounter: Payer: Self-pay | Admitting: General Surgery

## 2021-03-25 ENCOUNTER — Ambulatory Visit: Payer: Medicare Other | Admitting: Certified Registered"

## 2021-03-25 ENCOUNTER — Encounter: Payer: Self-pay | Admitting: General Surgery

## 2021-03-25 ENCOUNTER — Encounter
Admission: RE | Disposition: A | Discharge status: Home / Self Care | Payer: Self-pay | Source: Home / Self Care | Attending: General Surgery

## 2021-03-25 ENCOUNTER — Ambulatory Visit
Admission: RE | Admit: 2021-03-25 | Discharge: 2021-03-25 | Disposition: A | Discharge status: Home / Self Care | Payer: Medicare Other | Attending: General Surgery | Admitting: General Surgery

## 2021-03-25 DIAGNOSIS — K5730 Diverticulosis of large intestine without perforation or abscess without bleeding: Secondary | ICD-10-CM | POA: Diagnosis not present

## 2021-03-25 DIAGNOSIS — Z7982 Long term (current) use of aspirin: Secondary | ICD-10-CM | POA: Diagnosis not present

## 2021-03-25 DIAGNOSIS — Z79890 Hormone replacement therapy (postmenopausal): Secondary | ICD-10-CM | POA: Diagnosis not present

## 2021-03-25 DIAGNOSIS — Z1211 Encounter for screening for malignant neoplasm of colon: Secondary | ICD-10-CM | POA: Diagnosis not present

## 2021-03-25 DIAGNOSIS — Z79899 Other long term (current) drug therapy: Secondary | ICD-10-CM | POA: Diagnosis not present

## 2021-03-25 HISTORY — DX: Anxiety disorder, unspecified: F41.9

## 2021-03-25 HISTORY — DX: Hypothyroidism, unspecified: E03.9

## 2021-03-25 HISTORY — DX: Deficiency of other specified B group vitamins: E53.8

## 2021-03-25 HISTORY — PX: COLONOSCOPY WITH PROPOFOL: SHX5780

## 2021-03-25 SURGERY — COLONOSCOPY WITH PROPOFOL
Anesthesia: General

## 2021-03-25 MED ORDER — PROPOFOL 10 MG/ML IV BOLUS
INTRAVENOUS | Status: DC | PRN
  Administered 2021-03-25: 60 mg via INTRAVENOUS

## 2021-03-25 MED ORDER — SODIUM CHLORIDE 0.9 % IV SOLN
INTRAVENOUS | Status: DC
  Administered 2021-03-25: 1000 mL via INTRAVENOUS

## 2021-03-25 MED ORDER — PROPOFOL 500 MG/50ML IV EMUL
INTRAVENOUS | Status: DC | PRN
  Administered 2021-03-25: 130 ug/kg/min via INTRAVENOUS

## 2021-03-25 NOTE — Transfer of Care (Signed)
Immediate Anesthesia Transfer of Care Note  Patient: Rachel Hays  Procedure(s) Performed: COLONOSCOPY WITH PROPOFOL  Patient Location: PACU and Endoscopy Unit  Anesthesia Type:General  Level of Consciousness: drowsy  Airway & Oxygen Therapy: Patient Spontanous Breathing  Post-op Assessment: Report given to RN  Post vital signs: stable  Last Vitals:  Vitals Value Taken Time  BP    Temp    Pulse    Resp    SpO2      Last Pain:  Vitals:   03/25/21 0800  PainSc: 0-No pain         Complications: No notable events documented.

## 2021-03-25 NOTE — Anesthesia Postprocedure Evaluation (Signed)
Anesthesia Post Note  Patient: Rachel Hays  Procedure(s) Performed: COLONOSCOPY WITH PROPOFOL  Patient location during evaluation: Endoscopy Anesthesia Type: General Level of consciousness: awake and alert Pain management: pain level controlled Vital Signs Assessment: post-procedure vital signs reviewed and stable Respiratory status: spontaneous breathing, nonlabored ventilation, respiratory function stable and patient connected to nasal cannula oxygen Cardiovascular status: blood pressure returned to baseline and stable Postop Assessment: no apparent nausea or vomiting Anesthetic complications: no   No notable events documented.   Last Vitals:  Vitals:   03/25/21 0910 03/25/21 0920  BP: (!) 146/118 (!) 142/82  Pulse: 71 67  Resp: 17 (!) 24  Temp:    SpO2: 97% 100%    Last Pain:  Vitals:   03/25/21 0843  PainSc: 0-No pain                 [name] [name]

## 2021-03-25 NOTE — Op Note (Signed)
[facility] Patient Name: Rachel Hays Procedure Date: 03/25/2021 8:02 AM MRN: [id number] Account #: [id number] Date of Birth: May 12, 1946 Admit Type: Outpatient Age: 75 Room: [facility] ENDO ROOM 1 Gender: Female Note Status: Finalized Procedure:             Colonoscopy Indications:           Screening for colorectal malignant neoplasm Providers:             Robert Bellow, MD Medicines:             Propofol per Anesthesia Complications:         No immediate complications. Procedure:             Pre-Anesthesia Assessment:                        - Prior to the procedure, a History and Physical was                         performed, and patient medications, allergies and                         sensitivities were reviewed. The patient's tolerance                         of previous anesthesia was reviewed.                        - The risks and benefits of the procedure and the                         sedation options and risks were discussed with the                         patient. All questions were answered and informed                         consent was obtained.                        After obtaining informed consent, the colonoscope was                         passed under direct vision. Throughout the procedure,                         the patient's blood pressure, pulse, and oxygen                         saturations were monitored continuously. The                         Colonoscope was introduced through the anus and                         advanced to the the cecum, identified by appendiceal                         orifice and ileocecal valve. The colonoscopy was  performed without difficulty. The patient tolerated                         the procedure well. The quality of the bowel                         preparation was excellent. Findings:      Multiple medium-mouthed diverticula were found in the  recto-sigmoid       colon and sigmoid colon.      The retroflexed view of the distal rectum and anal verge was normal and       showed no anal or rectal abnormalities. Impression:            - Diverticulosis in the recto-sigmoid colon and in the                         sigmoid colon.                        - The distal rectum and anal verge are normal on                         retroflexion view.                        - No specimens collected. Recommendation:        - Discharge patient to home (via wheelchair). Procedure Code(s):     --- Professional ---                        [phone], Colonoscopy, flexible; diagnostic, including                         collection of specimen(s) by brushing or washing, when                         performed (separate procedure) Diagnosis Code(s):     --- Professional ---                        Z12.11, Encounter for screening for malignant neoplasm                         of colon                        K57.30, Diverticulosis of large intestine without                         perforation or abscess without bleeding CPT copyright 2019 [employer]. All rights reserved. The codes documented in this report are preliminary and upon coder review may  be revised to meet current compliance requirements. Robert Bellow, MD 03/25/2021 8:40:52 AM This report has been signed electronically. Number of Addenda: 0 Note Initiated On: 03/25/2021 8:02 AM Scope Withdrawal Time: 0 hours 8 minutes 55 seconds  Total Procedure Duration: 0 hours 15 minutes 13 seconds  Estimated Blood Loss:  Estimated blood loss: none.      [facility]

## 2021-03-25 NOTE — H&P (Signed)
Rachel Hays [id number] Jun 26, 1946     HPI: Very healthy 75 y/o woman for a screening colonoscopy.  Tolerated prep well.   Medications Prior to Admission  Medication Sig Dispense Refill Last Dose   ALPRAZolam (XANAX) 0.5 MG tablet Take 0.5 mg by mouth at bedtime as needed for anxiety.   Past Week   aspirin EC 81 MG tablet Take 81 mg by mouth daily. Swallow whole.   Past Week   Cyanocobalamin (B-12) 1000 MCG/ML KIT Inject as directed.   Past Week   famotidine (PEPCID) 20 MG tablet Take 1 tablet (20 mg total) by mouth at bedtime. Overdue for appointment with Dr. Melvyn Novas 90 tablet 0 03/24/2021   FLUoxetine (PROZAC) 20 MG capsule Take 20 mg by mouth daily.   Past Week   furosemide (LASIX) 20 MG tablet Take 20 mg by mouth as needed.   Past Week   HYDROcodone-homatropine (HYDROMET) 5-1.5 MG/5ML syrup Take 5 mLs by mouth every 6 (six) hours as needed. 240 mL 0 Past Week   levothyroxine (SYNTHROID, LEVOTHROID) 75 MCG tablet Take 75 mcg by mouth daily before breakfast.   03/24/2021   oxybutynin (DITROPAN-XL) 5 MG 24 hr tablet Take 5 mg by mouth at bedtime.   Past Week   pantoprazole (PROTONIX) 40 MG tablet Take 1 tablet (40 mg total) by mouth daily. Take 30-60 min before first meal of the day. (Overdue for appointment with Dr. Melvyn Novas) 90 tablet 0 Past Week   pravastatin (PRAVACHOL) 20 MG tablet Take 20 mg by mouth daily.   Past Week   Vitamin D, Ergocalciferol, (DRISDOL) 50000 units CAPS capsule Take 50,000 Units by mouth every 7 (seven) days.   Past Week   benzonatate (TESSALON) 200 MG capsule Take 1 capsule (200 mg total) by mouth 3 (three) times daily as needed for cough. 40 capsule 2  at prn   chlorpheniramine (CHLOR-TRIMETON) 4 MG tablet Take 4 mg by mouth at bedtime.    at prn   No Known Allergies Past Medical History:  Diagnosis Date   Anxiety disorder    Edema of foot    ankles swell   Environmental allergies    Hyperlipidemia    Hypothyroidism    Insomnia    Vitamin B12 deficiency     Past Surgical History:  Procedure Laterality Date   ABDOMINAL HYSTERECTOMY     CHOLECYSTECTOMY     ELBOW SURGERY     crushed   INCONTINENCE SURGERY     PARTIAL HYSTERECTOMY     TONSILLECTOMY  09/11/1964   Social History   Socioeconomic History   Marital status: Married    Spouse name: Not on file   Number of children: Not on file   Years of education: Not on file   Highest education level: Not on file  Occupational History   Occupation: [employer]  Tobacco Use   Smoking status: Never   Smokeless tobacco: Never  Vaping Use   Vaping Use: Never used  Substance and Sexual Activity   Alcohol use: No    Alcohol/week: 0.0 standard drinks   Drug use: No   Sexual activity: Not on file  Other Topics Concern   Not on file  Social History Narrative   Not on file   Social Determinants of Health   Financial Resource Strain: Not on file  Food Insecurity: Not on file  Transportation Needs: Not on file  Physical Activity: Not on file  Stress: Not on file  Social Connections: Not on  file  Intimate Partner Violence: Not on file   Social History   Social History Narrative   Not on file     ROS: Negative.     PE: HEENT: Negative. Lungs: Clear. Cardio: RR.  Assessment/Plan:  Proceed with planned endoscopy.   Forest Gleason [facility] 03/25/2021

## 2021-03-25 NOTE — Anesthesia Preprocedure Evaluation (Signed)
Anesthesia Evaluation  Patient identified by MRN, date of birth, ID band Patient awake    Reviewed: Allergy & Precautions, NPO status , Patient's Chart, lab work & pertinent test results  History of Anesthesia Complications Negative for: history of anesthetic complications  Airway Mallampati: III  TM Distance: <3 FB Neck ROM: full    Dental  (+) Chipped   Pulmonary neg pulmonary ROS, neg shortness of breath,    Pulmonary exam normal        Cardiovascular Exercise Tolerance: Good (-) anginanegative cardio ROS Normal cardiovascular exam     Neuro/Psych PSYCHIATRIC DISORDERS negative neurological ROS  negative psych ROS   GI/Hepatic negative GI ROS, Neg liver ROS,   Endo/Other  Hypothyroidism   Renal/GU negative Renal ROS  negative genitourinary   Musculoskeletal   Abdominal   Peds  Hematology negative hematology ROS (+)   Anesthesia Other Findings Past Medical History: No date: Anxiety disorder No date: Edema of foot     Comment:  ankles swell No date: Environmental allergies No date: Hyperlipidemia No date: Hypothyroidism No date: Insomnia No date: Vitamin B12 deficiency  Past Surgical History: No date: ABDOMINAL HYSTERECTOMY No date: CHOLECYSTECTOMY No date: ELBOW SURGERY     Comment:  crushed No date: INCONTINENCE SURGERY No date: PARTIAL HYSTERECTOMY 09/11/1964: TONSILLECTOMY  BMI    Body Mass Index: 28.41 kg/m      Reproductive/Obstetrics negative OB ROS                             Anesthesia Physical Anesthesia Plan  ASA: 2  Anesthesia Plan: General   Post-op Pain Management:    Induction: Intravenous  PONV Risk Score and Plan: Propofol infusion and TIVA  Airway Management Planned: Natural Airway and Nasal Cannula  Additional Equipment:   Intra-op Plan:   Post-operative Plan:   Informed Consent: I have reviewed the patients History and Physical,  chart, labs and discussed the procedure including the risks, benefits and alternatives for the proposed anesthesia with the patient or authorized representative who has indicated his/her understanding and acceptance.     Dental Advisory Given  Plan Discussed with: Anesthesiologist, CRNA and Surgeon  Anesthesia Plan Comments: (Patient consented for risks of anesthesia including but not limited to:  - adverse reactions to medications - risk of airway placement if required - damage to eyes, teeth, lips or other oral mucosa - nerve damage due to positioning  - sore throat or hoarseness - Damage to heart, brain, nerves, lungs, other parts of body or loss of life  Patient voiced understanding.)        Anesthesia Quick Evaluation

## 2021-03-28 ENCOUNTER — Encounter: Payer: Self-pay | Admitting: General Surgery

## 2021-04-12 ENCOUNTER — Encounter: Payer: Self-pay | Admitting: Internal Medicine
